# Patient Record
Sex: Female | Born: 1980 | Race: White | Hispanic: No | Marital: Married | Smoking: Never smoker
Health system: Southern US, Community
[De-identification: ages and names within clinical notes are randomized; demographics above are authoritative.]

## PROBLEM LIST (undated history)

## (undated) ENCOUNTER — Inpatient Hospital Stay (HOSPITAL_COMMUNITY): Payer: Self-pay

## (undated) DIAGNOSIS — R112 Nausea with vomiting, unspecified: Secondary | ICD-10-CM

## (undated) DIAGNOSIS — B009 Herpesviral infection, unspecified: Secondary | ICD-10-CM

## (undated) DIAGNOSIS — F32A Depression, unspecified: Secondary | ICD-10-CM

## (undated) DIAGNOSIS — I10 Essential (primary) hypertension: Secondary | ICD-10-CM

## (undated) DIAGNOSIS — T8859XA Other complications of anesthesia, initial encounter: Secondary | ICD-10-CM

## (undated) DIAGNOSIS — Z9889 Other specified postprocedural states: Secondary | ICD-10-CM

## (undated) DIAGNOSIS — N979 Female infertility, unspecified: Secondary | ICD-10-CM

## (undated) DIAGNOSIS — F329 Major depressive disorder, single episode, unspecified: Secondary | ICD-10-CM

## (undated) DIAGNOSIS — T4145XA Adverse effect of unspecified anesthetic, initial encounter: Secondary | ICD-10-CM

## (undated) HISTORY — PX: CHOLECYSTECTOMY: SHX55

---

## 2006-11-26 ENCOUNTER — Ambulatory Visit (HOSPITAL_COMMUNITY): Admission: RE | Admit: 2006-11-26 | Discharge: 2006-11-26 | Payer: Self-pay | Admitting: Obstetrics & Gynecology

## 2007-04-20 ENCOUNTER — Inpatient Hospital Stay (HOSPITAL_COMMUNITY): Admission: AD | Admit: 2007-04-20 | Discharge: 2007-04-22 | Payer: Self-pay | Admitting: Obstetrics and Gynecology

## 2008-03-29 IMAGING — US US OB COMP +14 WK
1 series · 14 of 28 positions shown · non-contrast
Comparison: none

OBSTETRICAL ULTRASOUND:

 This ultrasound exam was performed in the [HOSPITAL] Ultrasound Department.  The OB US report was generated in the AS system, and faxed to the ordering physician.  This report is also available in [REDACTED] PACS.

[Series 1: us ob comp +14 wk · 0.26mm/px · 14 of 75 slices shown]
[im 3/75]
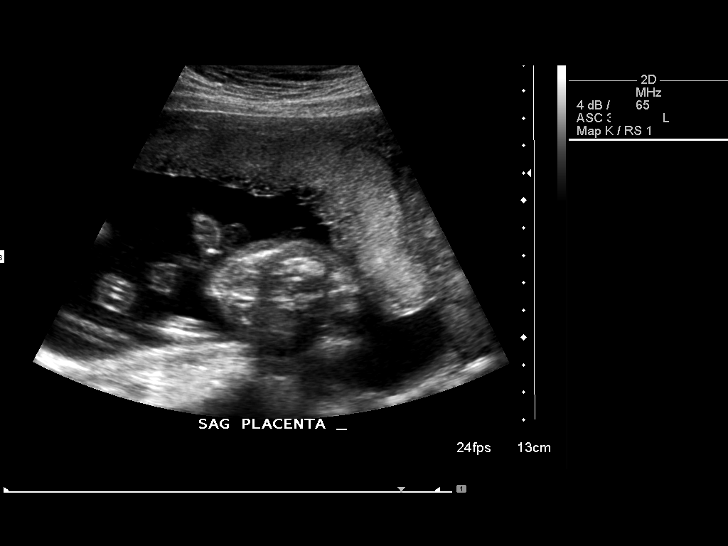
[im 9/75]
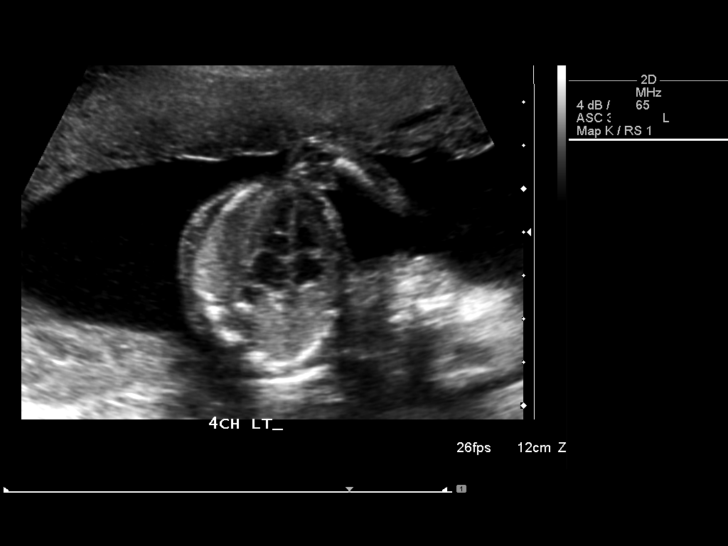
[im 14/75]
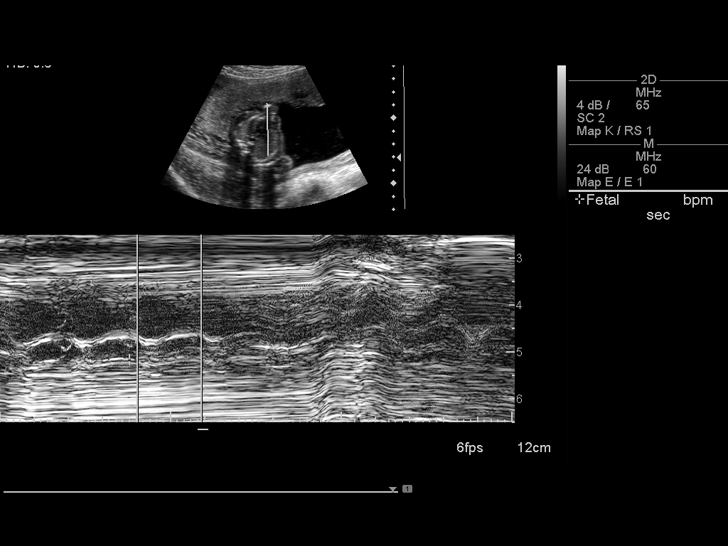
[im 20/75]
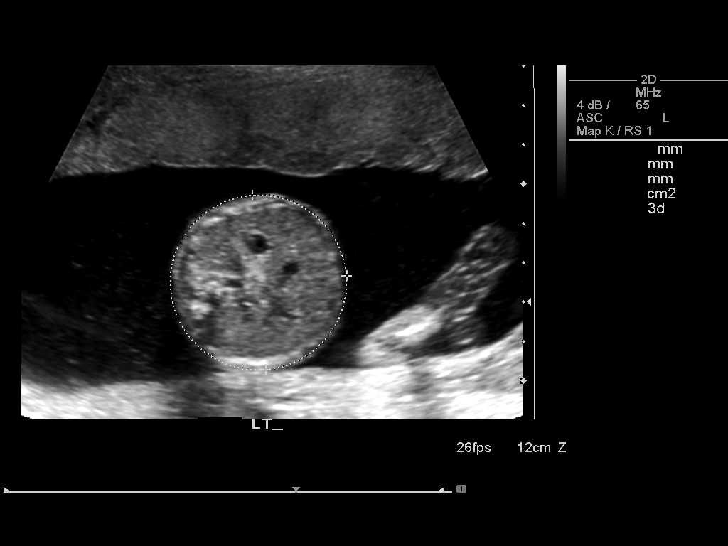
[im 25/75]
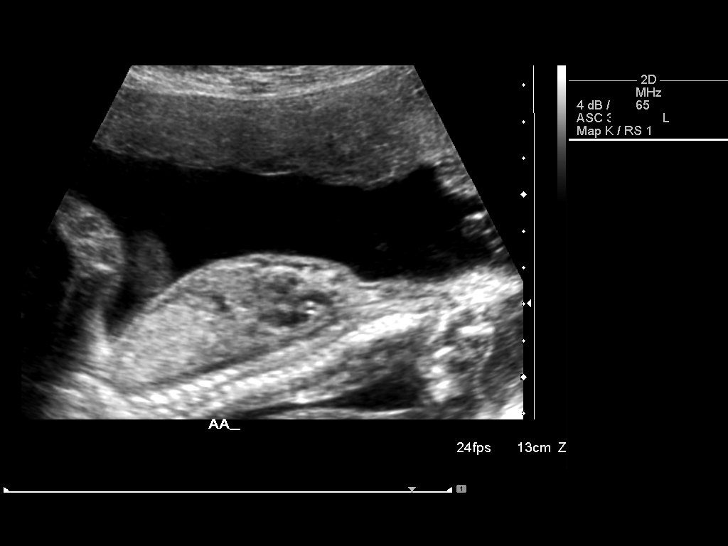
[im 31/75]
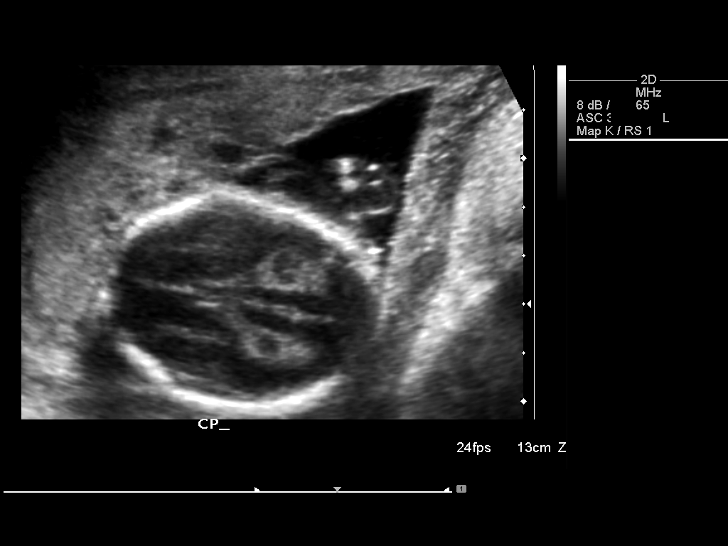
[im 36/75]
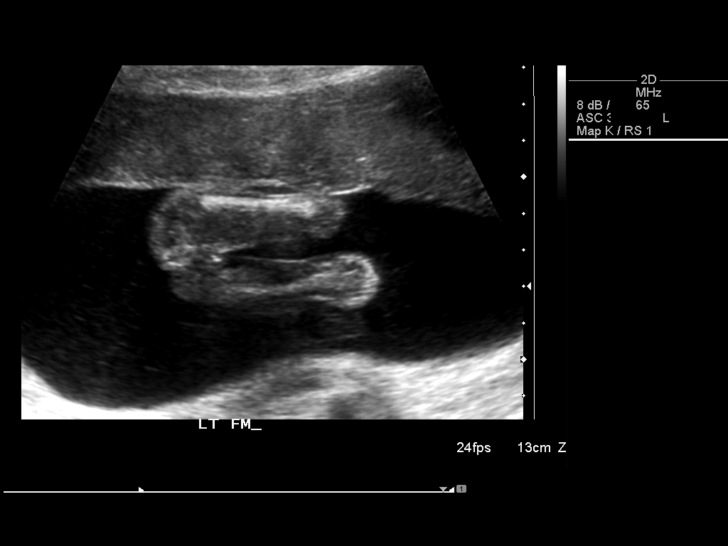
[im 42/75]
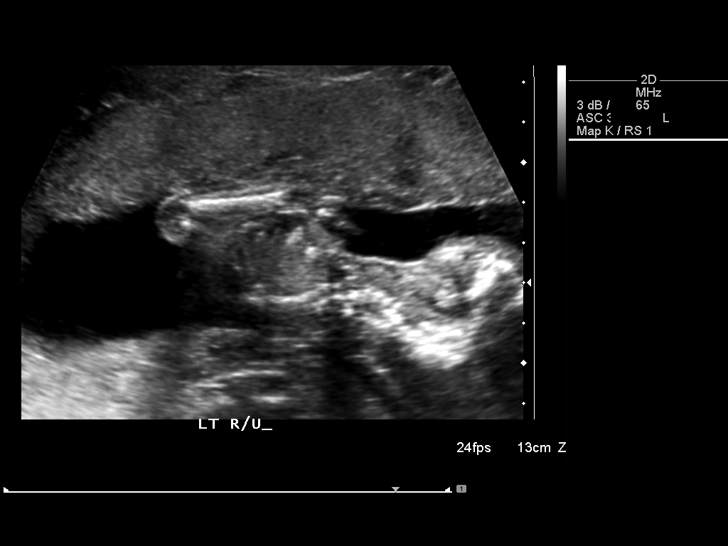
[im 47/75]
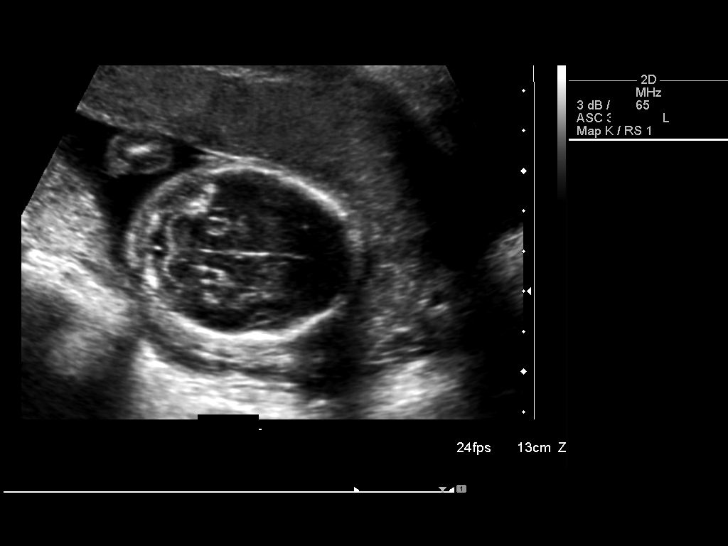
[im 53/75]
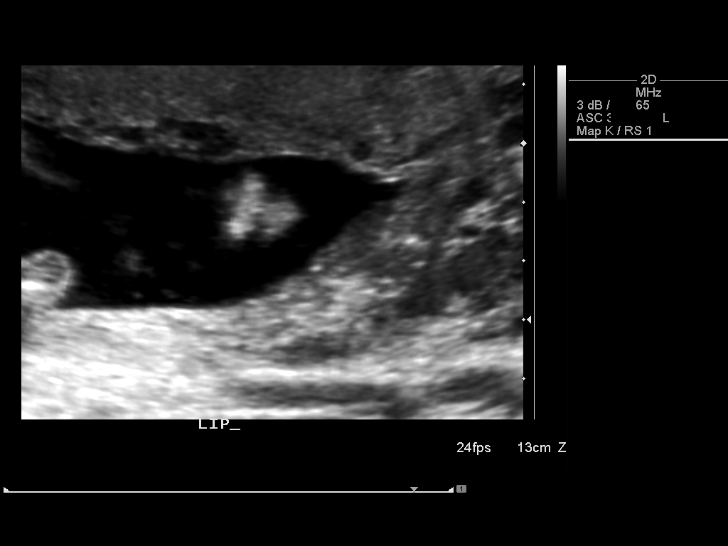
[im 58/75]
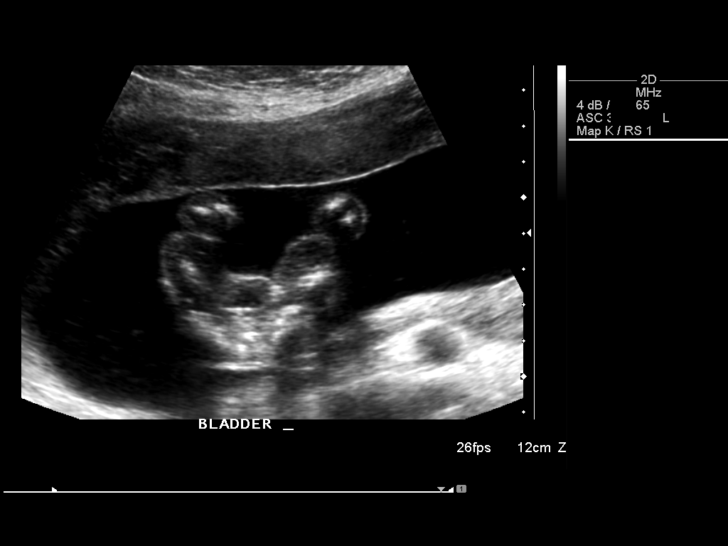
[im 64/75]
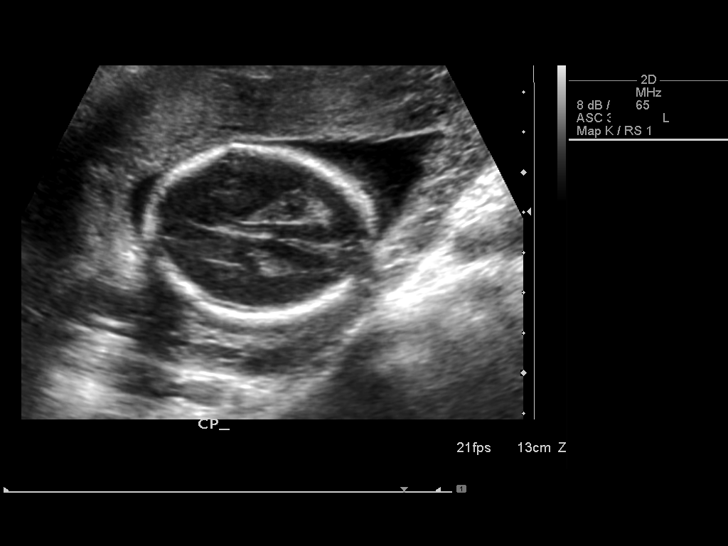
[im 69/75]
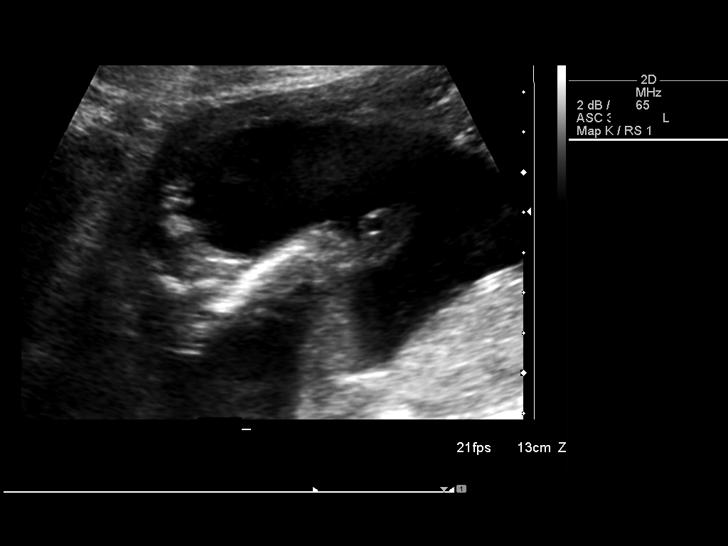
[im 75/75]
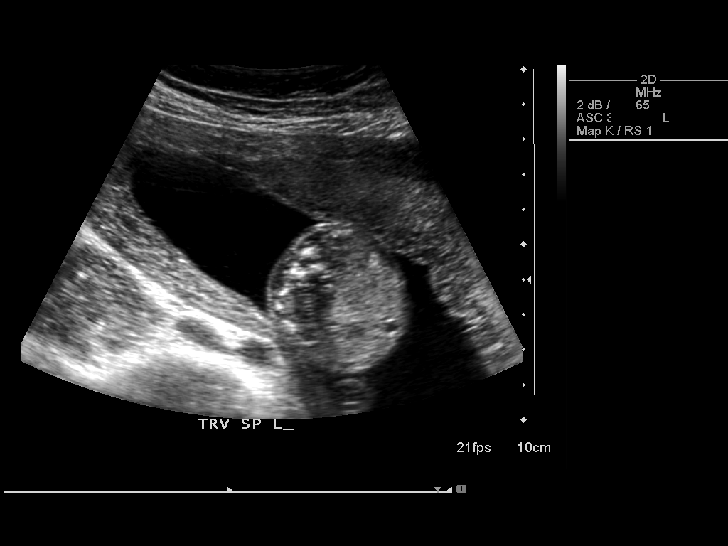

[14 of 28 positions shown; findings below may reference images not displayed]

IMPRESSION: See AS Obstetric US report.

## 2009-01-03 ENCOUNTER — Emergency Department (HOSPITAL_COMMUNITY): Admission: EM | Admit: 2009-01-03 | Discharge: 2009-01-03 | Payer: Self-pay | Admitting: Emergency Medicine

## 2011-01-22 NOTE — Discharge Summary (Signed)
Suzanne Santos, Suzanne Santos              ACCOUNT NO.:  [id number]   MEDICAL RECORD NO.:  [id number]          PATIENT TYPE:  INP   LOCATION:  9103                          FACILITY:  [facility]   PHYSICIAN:  Gerrit Friends. Aldona Bar, M.D.   DATE OF BIRTH:  04-25-1981   DATE OF ADMISSION:  04/20/2007  DATE OF DISCHARGE:  04/22/2007                               DISCHARGE SUMMARY   DISCHARGE DIAGNOSIS:  1. Term pregnancy delivered 7 pounds 8 ounces female infant, Apgars      09/09.  2. Blood type B positive.   PROCEDURES:  1. Normal spontaneous delivery.  2. Second-degree tear and repair.   SUMMARY:  This 30 year old primigravida was admitted at term with  contractions and minimal elevation of her blood pressure.  Her pregnancy  was complicated by the finding of bilateral choroid plexus cysts on her  anatomic scan at 18-20 weeks as well as a left ventricular echogenic  focus.  She was seen by the perinatal consultation.  Her quad screen  previously had been normal.  After counseling the patient declined  amniocentesis and was followed expectantly.   When she was admitted amniotomy was carried out with production of light  meconium-stained fluid.  The patient's labs were essentially normal.  Pitocin was added and epidural was requested and received and she  subsequently progressed and had a normal spontaneous delivery of 7  pounds 8 ounces female infant with Apgars of 09/09 over second-degree  tear which was repaired without difficulty.  There was a double nuchal  cord.  The patient's postpartum course was benign.  On the morning of  August 13, she was ambulating well, tolerating a regular diet well,  having normal bowel and bladder function, and was afebrile.  Her blood  pressure was stable, breast-feeding was going well and she was desirous  of discharge.  Discharge hemoglobin (August 12) was 8.3 with a white  count of 16,300, platelet count 200,000.   On the morning of August 13, she was given all  appropriate instructions  per discharge brochure and understood all instructions well.   DISCHARGE MEDICATIONS:  1. Prenatal vitamins - one a day.  2. Ferrous sulfate 325 mg daily.  3. She was given a prescription for Motrin 600 mg use every 6 hours as      needed for pain or cramps.   She will return to the office for follow-up in approximately four weeks'  time or as needed.   CONDITION ON DISCHARGE:  Improved.      Gerrit Friends. Aldona Bar, M.D.  Electronically Signed     RMW/MEDQ  D:  04/22/2007  T:  04/22/2007  Job:  [id number]

## 2011-06-24 LAB — CBC
HCT: 24.2 — ABNORMAL LOW
HCT: 34.1 — ABNORMAL LOW
Hemoglobin: 11.6 — ABNORMAL LOW
Hemoglobin: 8.3 — ABNORMAL LOW
MCHC: 34.1
MCV: 90.4
RBC: 3.77 — ABNORMAL LOW
RDW: 14.5 — ABNORMAL HIGH

## 2011-06-24 LAB — URIC ACID: Uric Acid, Serum: 4.2

## 2011-06-24 LAB — COMPREHENSIVE METABOLIC PANEL
ALT: 15
BUN: 5 — ABNORMAL LOW
CO2: 24
Calcium: 9.3
Creatinine, Ser: 0.57
GFR calc non Af Amer: >60
Glucose, Bld: 84

## 2011-06-24 LAB — URINALYSIS, ROUTINE W REFLEX MICROSCOPIC
Glucose, UA: NEGATIVE
Ketones, ur: NEGATIVE
Leukocytes, UA: NEGATIVE
Protein, ur: NEGATIVE

## 2011-06-24 LAB — LACTATE DEHYDROGENASE: LDH: 144

## 2011-06-24 LAB — URINE MICROSCOPIC-ADD ON

## 2011-06-24 LAB — RPR: RPR Ser Ql: NONREACTIVE

## 2011-09-05 ENCOUNTER — Other Ambulatory Visit (HOSPITAL_COMMUNITY): Payer: Self-pay | Admitting: Obstetrics and Gynecology

## 2011-09-05 DIAGNOSIS — N979 Female infertility, unspecified: Secondary | ICD-10-CM

## 2011-09-09 ENCOUNTER — Ambulatory Visit (HOSPITAL_COMMUNITY)
Admission: RE | Admit: 2011-09-09 | Discharge: 2011-09-09 | Disposition: A | Discharge status: Home / Self Care | Payer: 59 | Source: Ambulatory Visit | Attending: Obstetrics and Gynecology | Admitting: Obstetrics and Gynecology

## 2011-09-09 ENCOUNTER — Other Ambulatory Visit (HOSPITAL_COMMUNITY): Payer: Self-pay | Admitting: Obstetrics and Gynecology

## 2011-09-09 DIAGNOSIS — N979 Female infertility, unspecified: Secondary | ICD-10-CM

## 2011-10-15 ENCOUNTER — Emergency Department (HOSPITAL_BASED_OUTPATIENT_CLINIC_OR_DEPARTMENT_OTHER)
Admission: EM | Admit: 2011-10-15 | Discharge: 2011-10-15 | Disposition: A | Discharge status: Home / Self Care | Payer: 59 | Attending: Emergency Medicine | Admitting: Emergency Medicine

## 2011-10-15 ENCOUNTER — Emergency Department (INDEPENDENT_AMBULATORY_CARE_PROVIDER_SITE_OTHER): Payer: 59

## 2011-10-15 ENCOUNTER — Encounter (HOSPITAL_BASED_OUTPATIENT_CLINIC_OR_DEPARTMENT_OTHER): Payer: Self-pay | Admitting: *Deleted

## 2011-10-15 DIAGNOSIS — M542 Cervicalgia: Secondary | ICD-10-CM | POA: Insufficient documentation

## 2011-10-15 DIAGNOSIS — M5412 Radiculopathy, cervical region: Secondary | ICD-10-CM | POA: Insufficient documentation

## 2011-10-15 DIAGNOSIS — M5020 Other cervical disc displacement, unspecified cervical region: Secondary | ICD-10-CM

## 2011-10-15 DIAGNOSIS — M79609 Pain in unspecified limb: Secondary | ICD-10-CM

## 2011-10-15 MED ORDER — PREDNISONE 20 MG PO TABS: ORAL_TABLET | ORAL | Status: DC

## 2011-10-15 MED ORDER — IBUPROFEN 800 MG PO TABS: 800.0000 mg | ORAL_TABLET | Freq: Three times a day (TID) | ORAL | Status: AC

## 2011-10-15 MED ORDER — IBUPROFEN 800 MG PO TABS
800.0000 mg | ORAL_TABLET | Freq: Once | ORAL | Status: AC
  Administered 2011-10-15: 800 mg via ORAL
  Filled 2011-10-15: qty 1

## 2011-10-15 NOTE — ED Notes (Signed)
Patient states she woke up one week ago with pain in the center of her neck , felt like she slept wrong.  Pain is now radiating into her left upper arm, denies numbness or injury.

## 2011-10-15 NOTE — ED Provider Notes (Signed)
History     CSN: [id number]  Arrival date & time 10/15/11  4098   None     Chief Complaint  Patient presents with  . Neck Pain    radiating into left arm    (Consider location/radiation/quality/duration/timing/severity/associated sxs/prior treatment) Patient is a 31 y.o. female presenting with neck pain. The history is provided by the patient and medical records. No language interpreter was used.  Neck Pain  This is a new problem. Episode onset: Patient says that 4 weeks she's had a" crick" in her neck. She woke up like that one day. She has tried ibuprofen and Tylenol and Flexeril without relief. The problem occurs constantly. The problem has been gradually worsening. Associated with: There is no history of injury. There has been no fever. The pain is present in the left side. The quality of the pain is described as aching. The pain radiates to the left arm. The pain is at a severity of 7/10. The pain is moderate. Exacerbated by: Nothing. Pertinent negatives include no numbness, no bowel incontinence, no bladder incontinence, no paresis, no tingling and no weakness. She has tried NSAIDs and analgesics for the symptoms. The treatment provided no relief.    History reviewed. No pertinent past medical history.  Past Surgical History  Procedure Date  . Cholecystectomy     No family history on file.  History  Substance Use Topics  . Smoking status: Never Smoker   . Smokeless tobacco: Not on file  . Alcohol Use: Yes     occassionally    OB History    Grav Para Term Preterm Abortions TAB SAB Ect Mult Living                  Review of Systems  Constitutional: Negative for fever and chills.  HENT: Positive for neck pain.   Eyes: Negative.   Respiratory: Negative.   Cardiovascular: Negative.   Gastrointestinal: Negative.  Negative for bowel incontinence.  Genitourinary: Negative.  Negative for bladder incontinence.  Skin: Negative.   Neurological: Negative for tingling,  weakness and numbness.  Psychiatric/Behavioral: Negative.     Allergies  Review of patient's allergies indicates no known allergies.  Home Medications   Current Outpatient Rx  Name Route Sig Dispense Refill  . FLUOXETINE HCL 20 MG PO CAPS Oral Take 20 mg by mouth daily.    Marland Kitchen FOLIC ACID 1 MG PO TABS Oral Take 1 mg by mouth daily.    Marland Kitchen LETROZOLE 2.5 MG PO TABS Oral Take 2.5 mg by mouth daily. Days 3 through day 7 of cycle    . MULTI-VITAMIN/MINERALS PO TABS Oral Take 1 tablet by mouth daily.      BP 150/98  Pulse 58  Temp(Src) 98.4 F (36.9 C) (Oral)  Resp 20  Ht 5\' 7"  (1.702 m)  Wt 152 lb (68.947 kg)  BMI 23.81 kg/m2  SpO2 100%  LMP 10/01/2011  Physical Exam  Nursing note and vitals reviewed. Constitutional: She is oriented to person, place, and time. She appears well-developed and well-nourished. No distress.  HENT:  Head: Normocephalic and atraumatic.  Right Ear: External ear normal.  Left Ear: External ear normal.  Mouth/Throat: Oropharynx is clear and moist.  Eyes: Conjunctivae and EOM are normal. Pupils are equal, round, and reactive to light.  Neck:       She localizes pain to the left posterior cervical paraspinous muscles. There is no palpable deformity. Range of motion of her neck is full. She states that the  pain goes from her neck into her left arm.  Cardiovascular: Normal rate, regular rhythm and normal heart sounds.   Pulmonary/Chest: Effort normal and breath sounds normal.  Abdominal: Soft. Bowel sounds are normal.  Musculoskeletal: Normal range of motion.  Neurological: She is alert and oriented to person, place, and time. She has normal reflexes.       No sensory or motor deficits.  Skin: Skin is warm and dry.  Psychiatric: She has a normal mood and affect. Her behavior is normal.    ED Course  Procedures (including critical care time)  8:09 AM Patient was seen and had physical examination. MRI of the cervical spine was ordered. Oral ibuprofen was  ordered.  12:23 PM MRI shows L C6 impingment.  Rx with steroid taper, ibuprofen, F/U with Dr. Jeral Fruit, her choice of neurosurgeon.  1. Cervical radiculopathy at C6           Carleene Cooper III, MD 10/16/11 [phone]

## 2011-10-29 ENCOUNTER — Other Ambulatory Visit: Payer: Self-pay | Admitting: Neurosurgery

## 2011-10-29 ENCOUNTER — Encounter (HOSPITAL_COMMUNITY): Payer: Self-pay | Admitting: Pharmacy Technician

## 2011-11-04 NOTE — Pre-Procedure Instructions (Signed)
20 NAILANI FULL  11/04/2011   Your procedure is scheduled on:  Friday November 08, 2011  Report to [name] at 9:30 AM.  Call this number if you have problems the morning of surgery: [phone]   Remember:   Do not eat food:After Midnight.  May have clear liquids: up to 4 Hours before arrival. (up to 5:30am)  Clear liquids include soda, tea, black coffee, apple or grape juice, broth.  Take these medicines the morning of surgery with A SIP OF WATER: prozac, prednisone   Do not wear jewelry, make-up or nail polish.  Do not wear lotions, powders, or perfumes. You may wear deodorant.  Do not shave 48 hours prior to surgery.  Do not bring valuables to the hospital.  Contacts, dentures or bridgework may not be worn into surgery.  Leave suitcase in the car. After surgery it may be brought to your room.  For patients admitted to the hospital, checkout time is 11:00 AM the day of discharge.   Patients discharged the day of surgery will not be allowed to drive home.  Name and phone number of your driver:  LOU LEWIS--- MOTHER      [phone]*  Special Instructions: CHG Shower Use Special Wash: 1/2 bottle night before surgery and 1/2 bottle morning of surgery.   Please read over the following fact sheets that you were given: Pain Booklet, Coughing and Deep Breathing, MRSA Information and Surgical Site Infection Prevention

## 2011-11-05 ENCOUNTER — Encounter (HOSPITAL_COMMUNITY)
Admission: RE | Admit: 2011-11-05 | Discharge: 2011-11-05 | Disposition: A | Discharge status: Home / Self Care | Payer: 59 | Source: Ambulatory Visit | Attending: Neurosurgery | Admitting: Neurosurgery

## 2011-11-05 ENCOUNTER — Encounter (HOSPITAL_COMMUNITY): Payer: Self-pay

## 2011-11-05 HISTORY — DX: Nausea with vomiting, unspecified: R11.2

## 2011-11-05 HISTORY — DX: Major depressive disorder, single episode, unspecified: F32.9

## 2011-11-05 HISTORY — DX: Other specified postprocedural states: Z98.890

## 2011-11-05 HISTORY — DX: Depression, unspecified: F32.A

## 2011-11-05 LAB — SURGICAL PCR SCREEN
MRSA, PCR: NEGATIVE
Staphylococcus aureus: NEGATIVE

## 2011-11-05 LAB — BASIC METABOLIC PANEL
CO2: 27 mEq/L (ref 19–32)
Chloride: 103 mEq/L (ref 96–112)
GFR calc Af Amer: >90 mL/min (ref >90)
Potassium: 3.7 mEq/L (ref 3.5–5.1)
Sodium: 139 mEq/L (ref 135–145)

## 2011-11-05 LAB — CBC
HCT: 36 % (ref 36.0–46.0)
Hemoglobin: 11.5 g/dL — ABNORMAL LOW (ref 12.0–15.0)
RBC: 3.95 MIL/uL (ref 3.87–5.11)
RDW: 12.7 % (ref 11.5–15.5)
WBC: 6.1 10*3/uL (ref 4.0–10.5)

## 2011-11-07 MED ORDER — CEFAZOLIN SODIUM 1-5 GM-% IV SOLN
1.0000 g | INTRAVENOUS | Status: AC
  Administered 2011-11-08: 1 g via INTRAVENOUS
  Filled 2011-11-07: qty 50

## 2011-11-08 ENCOUNTER — Encounter (HOSPITAL_COMMUNITY): Payer: Self-pay | Admitting: Anesthesiology

## 2011-11-08 ENCOUNTER — Ambulatory Visit (HOSPITAL_COMMUNITY): Payer: 59

## 2011-11-08 ENCOUNTER — Ambulatory Visit (HOSPITAL_COMMUNITY): Payer: 59 | Admitting: Anesthesiology

## 2011-11-08 ENCOUNTER — Ambulatory Visit (HOSPITAL_COMMUNITY)
Admission: RE | Admit: 2011-11-08 | Discharge: 2011-11-09 | Disposition: A | Discharge status: Home / Self Care | Payer: 59 | Source: Ambulatory Visit | Attending: Neurosurgery | Admitting: Neurosurgery

## 2011-11-08 ENCOUNTER — Encounter (HOSPITAL_COMMUNITY)
Admission: RE | Disposition: A | Discharge status: Home / Self Care | Payer: Self-pay | Source: Ambulatory Visit | Attending: Neurosurgery

## 2011-11-08 DIAGNOSIS — F329 Major depressive disorder, single episode, unspecified: Secondary | ICD-10-CM | POA: Insufficient documentation

## 2011-11-08 DIAGNOSIS — M5020 Other cervical disc displacement, unspecified cervical region: Secondary | ICD-10-CM | POA: Insufficient documentation

## 2011-11-08 DIAGNOSIS — Z01812 Encounter for preprocedural laboratory examination: Secondary | ICD-10-CM | POA: Insufficient documentation

## 2011-11-08 DIAGNOSIS — F3289 Other specified depressive episodes: Secondary | ICD-10-CM | POA: Insufficient documentation

## 2011-11-08 DIAGNOSIS — M47812 Spondylosis without myelopathy or radiculopathy, cervical region: Secondary | ICD-10-CM | POA: Insufficient documentation

## 2011-11-08 HISTORY — PX: ANTERIOR CERVICAL DECOMP/DISCECTOMY FUSION: SHX1161

## 2011-11-08 SURGERY — ANTERIOR CERVICAL DECOMPRESSION/DISCECTOMY FUSION 1 LEVEL
Anesthesia: General | Wound class: Clean

## 2011-11-08 MED ORDER — PHENOL 1.4 % MT LIQD: 1.0000 | OROMUCOSAL | Status: DC | PRN

## 2011-11-08 MED ORDER — ONDANSETRON HCL 4 MG/2ML IJ SOLN
INTRAMUSCULAR | Status: DC | PRN
  Administered 2011-11-08: 4 mg via INTRAVENOUS

## 2011-11-08 MED ORDER — HYDROMORPHONE HCL PF 1 MG/ML IJ SOLN: 0.2500 mg | INTRAMUSCULAR | Status: DC | PRN

## 2011-11-08 MED ORDER — GLYCOPYRROLATE 0.2 MG/ML IJ SOLN
INTRAMUSCULAR | Status: DC | PRN
  Administered 2011-11-08: .5 mg via INTRAVENOUS

## 2011-11-08 MED ORDER — SODIUM CHLORIDE 0.9 % IJ SOLN: 3.0000 mL | INTRAMUSCULAR | Status: DC | PRN

## 2011-11-08 MED ORDER — THROMBIN 5000 UNITS EX SOLR
CUTANEOUS | Status: DC | PRN
  Administered 2011-11-08 (×2): 5000 [IU] via TOPICAL

## 2011-11-08 MED ORDER — DOCUSATE SODIUM 100 MG PO CAPS
100.0000 mg | ORAL_CAPSULE | Freq: Two times a day (BID) | ORAL | Status: DC
  Administered 2011-11-08 – 2011-11-09 (×2): 100 mg via ORAL
  Filled 2011-11-08 (×2): qty 1

## 2011-11-08 MED ORDER — MORPHINE SULFATE 4 MG/ML IJ SOLN: 4.0000 mg | INTRAMUSCULAR | Status: DC | PRN

## 2011-11-08 MED ORDER — ZOLPIDEM TARTRATE 5 MG PO TABS: 10.0000 mg | ORAL_TABLET | Freq: Every evening | ORAL | Status: DC | PRN

## 2011-11-08 MED ORDER — SCOPOLAMINE 1 MG/3DAYS TD PT72: 1.0000 | MEDICATED_PATCH | TRANSDERMAL | Status: DC

## 2011-11-08 MED ORDER — HEMOSTATIC AGENTS (NO CHARGE) OPTIME
TOPICAL | Status: DC | PRN
  Administered 2011-11-08: 1 via TOPICAL

## 2011-11-08 MED ORDER — FLUOXETINE HCL 20 MG PO CAPS
20.0000 mg | ORAL_CAPSULE | Freq: Every day | ORAL | Status: DC
  Filled 2011-11-08: qty 1

## 2011-11-08 MED ORDER — BACITRACIN 50000 UNITS IM SOLR
INTRAMUSCULAR | Status: AC
  Filled 2011-11-08: qty 1

## 2011-11-08 MED ORDER — MORPHINE SULFATE 2 MG/ML IJ SOLN: 0.0500 mg/kg | INTRAMUSCULAR | Status: DC | PRN

## 2011-11-08 MED ORDER — MEPERIDINE HCL 25 MG/ML IJ SOLN: 6.2500 mg | INTRAMUSCULAR | Status: DC | PRN

## 2011-11-08 MED ORDER — FENTANYL CITRATE 0.05 MG/ML IJ SOLN
INTRAMUSCULAR | Status: DC | PRN
  Administered 2011-11-08 (×2): 50 ug via INTRAVENOUS
  Administered 2011-11-08: 150 ug via INTRAVENOUS

## 2011-11-08 MED ORDER — MIDAZOLAM HCL 5 MG/5ML IJ SOLN
INTRAMUSCULAR | Status: DC | PRN
  Administered 2011-11-08: 2 mg via INTRAVENOUS

## 2011-11-08 MED ORDER — LACTATED RINGERS IV SOLN
INTRAVENOUS | Status: DC | PRN
  Administered 2011-11-08 (×2): via INTRAVENOUS

## 2011-11-08 MED ORDER — ONDANSETRON HCL 4 MG/2ML IJ SOLN
4.0000 mg | Freq: Once | INTRAMUSCULAR | Status: AC | PRN
  Administered 2011-11-08: 4 mg via INTRAVENOUS

## 2011-11-08 MED ORDER — 0.9 % SODIUM CHLORIDE (POUR BTL) OPTIME
TOPICAL | Status: DC | PRN
  Administered 2011-11-08: 1000 mL

## 2011-11-08 MED ORDER — ACETAMINOPHEN 325 MG PO TABS: 650.0000 mg | ORAL_TABLET | ORAL | Status: DC | PRN

## 2011-11-08 MED ORDER — MENTHOL 3 MG MT LOZG: 1.0000 | LOZENGE | OROMUCOSAL | Status: DC | PRN

## 2011-11-08 MED ORDER — SCOPOLAMINE 1 MG/3DAYS TD PT72
MEDICATED_PATCH | TRANSDERMAL | Status: DC | PRN
  Administered 2011-11-08: 1.5 mg via TRANSDERMAL

## 2011-11-08 MED ORDER — HYDROMORPHONE HCL PF 1 MG/ML IJ SOLN
INTRAMUSCULAR | Status: AC
  Filled 2011-11-08: qty 1

## 2011-11-08 MED ORDER — HYDROMORPHONE HCL PF 1 MG/ML IJ SOLN
0.2500 mg | INTRAMUSCULAR | Status: DC | PRN
  Administered 2011-11-08 (×2): 0.5 mg via INTRAVENOUS

## 2011-11-08 MED ORDER — MORPHINE SULFATE 2 MG/ML IJ SOLN
INTRAMUSCULAR | Status: AC
  Administered 2011-11-08: 2 mg via INTRAVENOUS
  Filled 2011-11-08: qty 1

## 2011-11-08 MED ORDER — DIAZEPAM 5 MG PO TABS
5.0000 mg | ORAL_TABLET | Freq: Four times a day (QID) | ORAL | Status: DC | PRN
  Administered 2011-11-08: 5 mg via ORAL
  Filled 2011-11-08: qty 1

## 2011-11-08 MED ORDER — SODIUM CHLORIDE 0.9 % IJ SOLN
3.0000 mL | Freq: Two times a day (BID) | INTRAMUSCULAR | Status: DC
  Administered 2011-11-08 (×2): 3 mL via INTRAVENOUS

## 2011-11-08 MED ORDER — OXYCODONE-ACETAMINOPHEN 5-325 MG PO TABS
1.0000 | ORAL_TABLET | ORAL | Status: DC | PRN
  Administered 2011-11-08 – 2011-11-09 (×5): 1 via ORAL
  Filled 2011-11-08 (×2): qty 1
  Filled 2011-11-08 (×2): qty 2
  Filled 2011-11-08: qty 1

## 2011-11-08 MED ORDER — ACETAMINOPHEN 650 MG RE SUPP: 650.0000 mg | RECTAL | Status: DC | PRN

## 2011-11-08 MED ORDER — ONDANSETRON HCL 4 MG/2ML IJ SOLN
INTRAMUSCULAR | Status: AC
  Filled 2011-11-08: qty 2

## 2011-11-08 MED ORDER — ONDANSETRON HCL 4 MG/2ML IJ SOLN: 4.0000 mg | Freq: Once | INTRAMUSCULAR | Status: DC | PRN

## 2011-11-08 MED ORDER — SODIUM CHLORIDE 0.9 % IV SOLN
INTRAVENOUS | Status: DC
  Administered 2011-11-08: 13:00:00 via INTRAVENOUS

## 2011-11-08 MED ORDER — CEFAZOLIN SODIUM 1-5 GM-% IV SOLN
1.0000 g | Freq: Three times a day (TID) | INTRAVENOUS | Status: AC
  Administered 2011-11-08: 1 g via INTRAVENOUS
  Filled 2011-11-08 (×2): qty 50

## 2011-11-08 MED ORDER — NEOSTIGMINE METHYLSULFATE 1 MG/ML IJ SOLN
INTRAMUSCULAR | Status: DC | PRN
  Administered 2011-11-08: 3 mg via INTRAVENOUS

## 2011-11-08 MED ORDER — THROMBIN 5000 UNITS EX SOLR
CUTANEOUS | Status: DC | PRN
  Administered 2011-11-08: 5000 [IU] via TOPICAL

## 2011-11-08 MED ORDER — ROCURONIUM BROMIDE 100 MG/10ML IV SOLN
INTRAVENOUS | Status: DC | PRN
  Administered 2011-11-08: 50 mg via INTRAVENOUS

## 2011-11-08 MED ORDER — ONDANSETRON HCL 4 MG/2ML IJ SOLN
4.0000 mg | INTRAMUSCULAR | Status: DC | PRN
  Administered 2011-11-09: 4 mg via INTRAVENOUS
  Filled 2011-11-08: qty 2

## 2011-11-08 MED ORDER — SODIUM CHLORIDE 0.9 % IV SOLN
INTRAVENOUS | Status: AC
  Filled 2011-11-08: qty 500

## 2011-11-08 MED ORDER — DEXAMETHASONE SODIUM PHOSPHATE 4 MG/ML IJ SOLN
INTRAMUSCULAR | Status: DC | PRN
  Administered 2011-11-08: 8 mg via INTRAVENOUS

## 2011-11-08 MED ORDER — MORPHINE SULFATE 4 MG/ML IJ SOLN: 0.0500 mg/kg | INTRAMUSCULAR | Status: DC | PRN

## 2011-11-08 MED ORDER — PROPOFOL 10 MG/ML IV EMUL
INTRAVENOUS | Status: DC | PRN
  Administered 2011-11-08: 120 mg via INTRAVENOUS

## 2011-11-08 MED ORDER — LIDOCAINE HCL (CARDIAC) 20 MG/ML IV SOLN
INTRAVENOUS | Status: DC | PRN
  Administered 2011-11-08: 50 mg via INTRAVENOUS

## 2011-11-08 SURGICAL SUPPLY — 61 items
APL SKNCLS STERI-STRIP NONHPOA (GAUZE/BANDAGES/DRESSINGS) ×1
BANDAGE GAUZE ELAST BULKY 4 IN (GAUZE/BANDAGES/DRESSINGS) ×4 IMPLANT
BENZOIN TINCTURE PRP APPL 2/3 (GAUZE/BANDAGES/DRESSINGS) ×2 IMPLANT
BIT DRILL SPINE QC 12 (BIT) ×1 IMPLANT
BLADE ULTRA TIP 2M (BLADE) ×2 IMPLANT
BUR BARREL STRAIGHT FLUTE 4.0 (BURR) ×1 IMPLANT
BUR MATCHSTICK NEURO 3.0 LAGG (BURR) ×2 IMPLANT
CANISTER SUCTION 2500CC (MISCELLANEOUS) ×2 IMPLANT
CLOTH BEACON ORANGE TIMEOUT ST (SAFETY) ×2 IMPLANT
CONT SPEC 4OZ CLIKSEAL STRL BL (MISCELLANEOUS) ×2 IMPLANT
COVER MAYO STAND STRL (DRAPES) ×2 IMPLANT
DRAPE LAPAROTOMY 100X72 PEDS (DRAPES) ×2 IMPLANT
DRAPE MICROSCOPE LEICA (MISCELLANEOUS) ×2 IMPLANT
DRAPE POUCH INSTRU U-SHP 10X18 (DRAPES) ×2 IMPLANT
DRAPE PROXIMA HALF (DRAPES) ×1 IMPLANT
DURAPREP 6ML APPLICATOR 50/CS (WOUND CARE) ×2 IMPLANT
ELECT REM PT RETURN 9FT ADLT (ELECTROSURGICAL) ×2
ELECTRODE REM PT RTRN 9FT ADLT (ELECTROSURGICAL) ×1 IMPLANT
GAUZE SPONGE 4X4 16PLY XRAY LF (GAUZE/BANDAGES/DRESSINGS) IMPLANT
GLOVE BIOGEL M 8.0 STRL (GLOVE) ×3 IMPLANT
GLOVE BIOGEL PI IND STRL 7.0 (GLOVE) IMPLANT
GLOVE BIOGEL PI IND STRL 8 (GLOVE) IMPLANT
GLOVE BIOGEL PI INDICATOR 7.0 (GLOVE) ×1
GLOVE BIOGEL PI INDICATOR 8 (GLOVE) ×1
GLOVE ECLIPSE 7.5 STRL STRAW (GLOVE) ×2 IMPLANT
GLOVE ECLIPSE 8.5 STRL (GLOVE) ×1 IMPLANT
GLOVE EXAM NITRILE LRG STRL (GLOVE) IMPLANT
GLOVE EXAM NITRILE MD LF STRL (GLOVE) IMPLANT
GLOVE EXAM NITRILE XL STR (GLOVE) IMPLANT
GLOVE EXAM NITRILE XS STR PU (GLOVE) IMPLANT
GLOVE SS BIOGEL STRL SZ 6.5 (GLOVE) IMPLANT
GLOVE SUPERSENSE BIOGEL SZ 6.5 (GLOVE) ×1
GLOVE SURG SS PI 6.5 STRL IVOR (GLOVE) ×2 IMPLANT
GOWN BRE IMP SLV AUR LG STRL (GOWN DISPOSABLE) ×3 IMPLANT
GOWN BRE IMP SLV AUR XL STRL (GOWN DISPOSABLE) ×2 IMPLANT
GOWN STRL REIN 2XL LVL4 (GOWN DISPOSABLE) IMPLANT
HEMOSTAT POWDER KIT SURGIFOAM (HEMOSTASIS) ×1 IMPLANT
HEMOSTAT POWDER SURGIFOAM 1G (HEMOSTASIS) ×1 IMPLANT
KIT BASIN OR (CUSTOM PROCEDURE TRAY) ×2 IMPLANT
KIT ROOM TURNOVER OR (KITS) ×2 IMPLANT
NDL SPNL 22GX3.5 QUINCKE BK (NEEDLE) ×1 IMPLANT
NEEDLE SPNL 22GX3.5 QUINCKE BK (NEEDLE) ×2 IMPLANT
NS IRRIG 1000ML POUR BTL (IV SOLUTION) ×2 IMPLANT
PACK LAMINECTOMY NEURO (CUSTOM PROCEDURE TRAY) ×2 IMPLANT
PATTIES SURGICAL .5 X1 (DISPOSABLE) ×2 IMPLANT
PLATE ANT CERV XTEND 1 LV 16 (Plate) ×1 IMPLANT
PUTTY DBX 1CC (Putty) ×2 IMPLANT
PUTTY DBX 1CC DEPUY (Putty) IMPLANT
RUBBERBAND STERILE (MISCELLANEOUS) ×4 IMPLANT
SCREW XTD VAR 4.2 SELF TAP 12 (Screw) ×8 IMPLANT
SPACER COLONIAL SMALL 8MM (Spacer) ×1 IMPLANT
SPONGE GAUZE 4X4 12PLY (GAUZE/BANDAGES/DRESSINGS) ×1 IMPLANT
SPONGE INTESTINAL PEANUT (DISPOSABLE) ×3 IMPLANT
SPONGE SURGIFOAM ABS GEL SZ50 (HEMOSTASIS) ×2 IMPLANT
STRIP CLOSURE SKIN 1/2X4 (GAUZE/BANDAGES/DRESSINGS) ×1 IMPLANT
SUT VIC AB 3-0 SH 8-18 (SUTURE) ×2 IMPLANT
SYR 20ML ECCENTRIC (SYRINGE) ×2 IMPLANT
TAPE CLOTH SURG 4X10 WHT LF (GAUZE/BANDAGES/DRESSINGS) ×1 IMPLANT
TOWEL OR 17X24 6PK STRL BLUE (TOWEL DISPOSABLE) ×2 IMPLANT
TOWEL OR 17X26 10 PK STRL BLUE (TOWEL DISPOSABLE) ×2 IMPLANT
WATER STERILE IRR 1000ML POUR (IV SOLUTION) ×2 IMPLANT

## 2011-11-08 NOTE — Anesthesia Preprocedure Evaluation (Addendum)
Anesthesia Evaluation  Patient identified by MRN, date of birth, ID band Patient awake    Reviewed: Allergy & Precautions, H&P , NPO status , Patient's Chart, lab work & pertinent test results  History of Anesthesia Complications (+) PONV  Airway       Dental   Pulmonary          Cardiovascular     Neuro/Psych Depression    GI/Hepatic   Endo/Other    Renal/GU      Musculoskeletal   Abdominal   Peds  Hematology   Anesthesia Other Findings   Reproductive/Obstetrics                          Anesthesia Physical Anesthesia Plan  ASA: II  Anesthesia Plan: General   Post-op Pain Management:    Induction: Intravenous  Airway Management Planned: Oral ETT  Additional Equipment:   Intra-op Plan:   Post-operative Plan: Extubation in OR  Informed Consent: I have reviewed the patients History and Physical, chart, labs and discussed the procedure including the risks, benefits and alternatives for the proposed anesthesia with the patient or authorized representative who has indicated his/her understanding and acceptance.   Dental advisory given  Plan Discussed with: CRNA and Surgeon  Anesthesia Plan Comments:        Anesthesia Quick Evaluation

## 2011-11-08 NOTE — Anesthesia Postprocedure Evaluation (Signed)
Anesthesia Post Note  Patient: Suzanne Santos  Procedure(s) Performed: Procedure(s) (LRB): ANTERIOR CERVICAL DECOMPRESSION/DISCECTOMY FUSION 1 LEVEL (N/A)  Anesthesia type: general  Patient location: PACU  Post pain: Pain level controlled  Post assessment: Patient's Cardiovascular Status Stable  Last Vitals:  Filed Vitals:   11/08/11 0945  BP: 130/74  Pulse: 67  Temp:   Resp: 15    Post vital signs: Reviewed and stable  Level of consciousness: sedated  Complications: No apparent anesthesia complications

## 2011-11-08 NOTE — Preoperative (Signed)
Beta Blockers   Reason not to administer Beta Blockers:Not Applicable 

## 2011-11-08 NOTE — H&P (Signed)
Suzanne Santos is an 31 y.o. female.   Chief Complaint: neck pain. HPI: 7 weeks ago suddenly she started to have neck pain which required adjustment. Later on the pain got worse with radiation to the left upper extremity associteda with tingling. No help with conservative treatment. Mri of the cervical spine was done and send to [place].  Past Medical History  Diagnosis Date  . PONV (postoperative nausea and vomiting)     AFTER GALLBLADDER REMOVED  . Depression     PREVENTITIVE    Past Surgical History  Procedure Date  . Cholecystectomy     No family history on file. Social History:  reports that she has never smoked. She does not have any smokeless tobacco history on file. She reports that she drinks alcohol. She reports that she does not use illicit drugs.  Allergies: No Known Allergies  Medications Prior to Admission  Medication Dose Route Frequency Provider Last Rate Last Dose  . bacitracin 45409 UNITS injection           . ceFAZolin (ANCEF) IVPB 1 g/50 mL premix  1 g Intravenous 60 min Pre-Op Karn Cassis, MD      . HYDROmorphone (DILAUDID) injection 0.25-0.5 mg  0.25-0.5 mg Intravenous Q5 min PRN Aubery Lapping, MD      . HYDROmorphone (DILAUDID) injection 0.25-0.5 mg  0.25-0.5 mg Intravenous Q5 min PRN Aubery Lapping, MD      . meperidine (DEMEROL) injection 6.25-12.5 mg  6.25-12.5 mg Intravenous Q5 min PRN Aubery Lapping, MD      . meperidine (DEMEROL) injection 6.25-12.5 mg  6.25-12.5 mg Intravenous Q5 min PRN Aubery Lapping, MD      . morphine 2 MG/ML injection 0.05 mg/kg  0.05 mg/kg Intravenous Q10 min PRN Aubery Lapping, MD      . morphine 2 MG/ML injection 0.05 mg/kg  0.05 mg/kg Intravenous Q10 min PRN Aubery Lapping, MD      . ondansetron [facility]) injection 4 mg  4 mg Intravenous Once PRN Aubery Lapping, MD      . ondansetron [facility]) injection 4 mg  4 mg Intravenous Once PRN Aubery Lapping, MD      . sodium chloride 0.9 % infusion             Medications Prior to Admission  Medication Sig Dispense Refill  . FLUoxetine (PROZAC) 20 MG capsule Take 20 mg by mouth daily.      . folic acid (FOLVITE) 1 MG tablet Take 1 mg by mouth daily.      . Multiple Vitamins-Minerals (MULTIVITAMIN WITH MINERALS) tablet Take 1 tablet by mouth daily.      . predniSONE (DELTASONE) 20 MG tablet Take 3 tablets per day for 2 days, then 2 tablets per day for 2 days, then 1 tablet per day for 2 days.  12 tablet  0    No results found for this or any previous visit (from the past 48 hour(s)). No results found.  Review of Systems  Constitutional: Negative.   HENT: Positive for neck pain.   Eyes: Negative.   Respiratory: Negative.   Cardiovascular: Negative.   Gastrointestinal: Negative.   Genitourinary: Negative.   Skin: Negative.   Neurological: Positive for focal weakness.  Endo/Heme/Allergies: Negative.   Psychiatric/Behavioral: Negative.     Blood pressure 113/74, pulse 61, temperature 98.1 F (36.7 C), temperature source Oral, resp. rate 18, last menstrual period 10/01/2011, SpO2 98.00%. Physical Exam hent, nl. Neck, decrease  of flexibility . Lungs, clear. Cv, nl. Abdomren, nl. Extremities, nl. NEURO 2/5 WEAKNESS OF THE LEFT BICEPS. ABSENT LEFT TRICEPS REFLEX. MRI CERVICAL SPINE  LARGE HNP AT C5 6 WITH DISPLACEMENT OF CORD   Assessment/Plan   acd with plate at Z61   Suzanne Santos M 11/08/2011, 7:27 AM

## 2011-11-08 NOTE — Progress Notes (Signed)
acd at c56 was done, operative report [phone]

## 2011-11-08 NOTE — Transfer of Care (Signed)
Immediate Anesthesia Transfer of Care Note  Patient: Suzanne Santos  Procedure(s) Performed: Procedure(s) (LRB): ANTERIOR CERVICAL DECOMPRESSION/DISCECTOMY FUSION 1 LEVEL (N/A)  Patient Location: PACU  Anesthesia Type: General  Level of Consciousness: awake and alert   Airway & Oxygen Therapy: Patient Spontanous Breathing and Patient connected to nasal cannula oxygen  Post-op Assessment: Report given to PACU RN  Post vital signs: Reviewed and stable  Complications: No apparent anesthesia complications

## 2011-11-09 MED ORDER — DIAZEPAM 5 MG PO TABS: 5.0000 mg | ORAL_TABLET | Freq: Four times a day (QID) | ORAL | Status: AC | PRN

## 2011-11-09 MED ORDER — OXYCODONE-ACETAMINOPHEN 5-325 MG PO TABS: 1.0000 | ORAL_TABLET | ORAL | Status: AC | PRN

## 2011-11-09 NOTE — Op Note (Signed)
Suzanne Santos, Suzanne Santos              ACCOUNT NO.:  [id number]  MEDICAL RECORD NO.:  [id number]  LOCATION:  3537                         FACILITY:  [facility]  PHYSICIAN:  Hilda Lias, M.D.   DATE OF BIRTH:  Jan 23, 1981  DATE OF PROCEDURE:  11/08/2011 DATE OF DISCHARGE:                              OPERATIVE REPORT   PREOPERATIVE DIAGNOSIS:  C5-6 herniated disk, central to the left with acute on chronic radiculopathy.  POSTOPERATIVE DIAGNOSIS:  C5-6 herniated disk, central to the left with acute on chronic radiculopathy.  PROCEDURE:  Anterior 5-6 diskectomy, decompression of the spinal cord, interbody fusion with cage, plate, microscope.  SURGEON:  Hilda Lias, M.D.  ASSISTANT:  Kathaleen Maser. Pool, M.D.  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  The patient had conservative treatment.  Later on, the pain went down to the left upper extremity associated with weakness. Conservative treatment has been a failure.  MRI showed a large herniated disk, central to the left.  At the end, the patient wanted to proceed with surgery because she was unable to tolerate the pain.  The risks were explained to her in my office.  PROCEDURE:  The patient was taken to the OR, and after intubation, the left side of the neck was cleaned with DuraPrep.  Traction was applied to the left with 5 pounds.  The skin was cleaned with DuraPrep.  Drapes were applied.  Transverse incision was made through the skin, subcutaneous tissue, platysma down to the cervical spine.  First x-ray showed that we were at the level of C6-7.  From then on, we moved one space up, we opened anterior ligament at C5-6.  Large amount of degenerative disk were removed.  We went straight to the posterior ligament.  We opened the right side foramen.  In the left side, we found a large herniated disk, displacing the  spinal cord and compromise at C6 nerve root.  Decompression was achieved.  At the end, we had plenty of space for the spinal cord on both C6 nerve root.  Then, the endplate was drilled, a cage of 8 mm lordotic with autograft _and dbx_________ inside was inserted.  This was followed by a plate using 4 screws.  Two x-rays showed that we were at the level of 5-6 with a normal position of the cage and the plate.  From then on, the area was irrigated.  Hemostasis was accomplished, and the wound was closed with Vicryl and Steri-Strips.          ______________________________ Hilda Lias, M.D.    EB/MEDQ  D:  11/08/2011  T:  11/08/2011  Job:  [id number]

## 2011-11-09 NOTE — Discharge Summary (Signed)
Physician Discharge Summary  Patient ID: Suzanne Santos MRN: [id number] DOB/AGE: [birth year] 30 y.o.  Admit date: 11/08/2011 Discharge date: 11/09/2011  Admission Diagnoses: Cervical spondylosis with radiculopathy C5-C6  Discharge Diagnoses: Herbal spondylosis with radiculopathy C5-C6 Active Problems:  * No active hospital problems. *    Discharged Condition: good  Hospital Course: He was admitted to undergo anterior cervical decompression arthrodesis which he tolerated quite well at the level of C5-C6 she is discharged home area  Consults: None  Significant Diagnostic Studies: None    Discharge Exam: Blood pressure 106/65, pulse 71, temperature 98.5 F (36.9 C), temperature source Oral, resp. rate 20, last menstrual period 10/01/2011, SpO2 100.00%. Incision clean and dry motor function intact.  Disposition: 01-Home or Self Care  Discharge Orders    Future Orders Please Complete By Expires   Diet - low sodium heart healthy      Increase activity slowly      Discharge instructions      Comments:   Remove dressing in next day or 2. Okay to shower. No vigorous activity with upper extremities.   Call MD for:  temperature >100.4      Call MD for:  severe uncontrolled pain      Call MD for:  redness, tenderness, or signs of infection (pain, swelling, redness, odor or green/yellow discharge around incision site)        Medication List  As of 11/09/2011  9:31 AM   STOP taking these medications         diclofenac 75 MG EC tablet      ibuprofen 800 MG tablet         TAKE these medications         diazepam 5 MG tablet   Commonly known as: VALIUM   Take 1 tablet (5 mg total) by mouth every 6 (six) hours as needed (Muscle spasm).      FLUoxetine 20 MG capsule   Commonly known as: PROZAC   Take 20 mg by mouth daily.      folic acid 1 MG tablet   Commonly known as: FOLVITE   Take 1 mg by mouth daily.      meloxicam 15 MG tablet   Commonly known as: MOBIC   Take 15 mg by  mouth daily.      multivitamin with minerals tablet   Take 1 tablet by mouth daily.      OVER THE COUNTER MEDICATION   Apply 1 application topically 4 (four) times daily as needed. For pain  --homeopathic/herbal topical pain balm      oxyCODONE-acetaminophen 5-325 MG per tablet   Commonly known as: PERCOCET   Take 1-2 tablets by mouth every 4 (four) hours as needed for pain.      predniSONE 20 MG tablet   Commonly known as: DELTASONE   Take 3 tablets per day for 2 days, then 2 tablets per day for 2 days, then 1 tablet per day for 2 days.      traMADol 50 MG tablet   Commonly known as: ULTRAM   Take 50 mg by mouth every 6 (six) hours as needed.           Follow-up Information    Follow up with Karn Cassis, MD. Schedule an appointment as soon as possible for a visit in 3 weeks. (Call [facility] for appointment)    Contact information:   [address] [place] [address] [phone]  SignedStefani Dama 11/09/2011, 9:31 AM

## 2011-11-09 NOTE — Discharge Instructions (Signed)
Wound Care °Leave incision open to air. °You may shower. °Do not scrub directly on incision.  °Leave steri-strips on neck.  They will fall off themselves. °Do not put any creams, lotions, or ointments on incision. °Activity °Walk each and every day, increasing distance each day. °No lifting greater than 5 lbs.  Avoid excessive neck motion. °No driving for 2 weeks; may ride as a passenger locally. °Wear neck brace at all times except when showering.  If provided soft collar, may wear for comfort unless otherwise instructed. °Diet °Resume your normal diet.  °Return to Work °Will be discussed at you follow up appointment. °Call Your Doctor If Any of These Occur °Redness, drainage, or swelling at the wound.  °Temperature greater than 101 degrees. °Severe pain not relieved by pain medication. °Increased difficulty swallowing. °Incision starts to come apart. °Follow Up Appt °Call today for appointment in 3-4 weeks ([phone]) or for problems.  If you have any hardware placed in your spine, you will need an x-ray before your appointment. ° °

## 2011-11-13 ENCOUNTER — Encounter (HOSPITAL_COMMUNITY): Payer: Self-pay | Admitting: Neurosurgery

## 2012-04-30 ENCOUNTER — Encounter (HOSPITAL_COMMUNITY): Payer: Self-pay | Admitting: *Deleted

## 2012-04-30 ENCOUNTER — Emergency Department (HOSPITAL_COMMUNITY)
Admission: EM | Admit: 2012-04-30 | Discharge: 2012-04-30 | Disposition: A | Discharge status: Home / Self Care | Payer: 59 | Attending: Emergency Medicine | Admitting: Emergency Medicine

## 2012-04-30 ENCOUNTER — Emergency Department (HOSPITAL_COMMUNITY): Payer: 59

## 2012-04-30 DIAGNOSIS — R079 Chest pain, unspecified: Secondary | ICD-10-CM | POA: Insufficient documentation

## 2012-04-30 LAB — BASIC METABOLIC PANEL
CO2: 22 mEq/L (ref 19–32)
Chloride: 101 mEq/L (ref 96–112)
Sodium: 133 mEq/L — ABNORMAL LOW (ref 135–145)

## 2012-04-30 LAB — CBC
MCV: 91 fL (ref 78.0–100.0)
Platelets: 266 10*3/uL (ref 150–400)
RBC: 3.45 MIL/uL — ABNORMAL LOW (ref 3.87–5.11)
WBC: 11.9 10*3/uL — ABNORMAL HIGH (ref 4.0–10.5)

## 2012-04-30 LAB — TROPONIN I: Troponin I: <0.3 ng/mL (ref <0.30)

## 2012-04-30 NOTE — ED Provider Notes (Signed)
History   This chart was scribed for Donnetta Hutching, MD by Sofie Rower. The patient was seen in room APA18/APA18 and the patient's care was started at 3:41PM    CSN: [id number]  Arrival date & time 04/30/12  1431   First MD Initiated Contact with Patient 04/30/12 1505      Chief Complaint  Patient presents with  . Chest Pain    (Consider location/radiation/quality/duration/timing/severity/associated sxs/prior treatment) Patient is a 31 y.o. female presenting with chest pain. The history is provided by the patient. No language interpreter was used.  Chest Pain     JILLAINE WAREN is a 31 y.o. female who presents to the Emergency Department complaining of   Sudden, progressively worsening, chest pain, located at the left side of the chest, onset today (1:15PM), radiating towards the anterior left shoulder with associated symptoms of nausea, and vomiting. The pt describes the chest pain as an aching, dull pain, which becomes sharp as she breathes deeply. Modifying factors include taking a deep breath which intensifies the chest pain.The pt reports she is pregnant [redacted] weeks, and 2 days.  The pt has a hx of cholecystectomy (2002), spinal effusion ( located at C5 and C6, performed by Dr. Jeral Fruit on 11/08/11, Neurosurgeon, [facility]) and a familial hx of MI (father age 29). The pt denies dizziness, shortness of breath, hypertension, diabetes, cramping, and vaginal bleeding.   The pt does not smoke, however, drinks alcohol occasionally. The pt is employed as a Production manager.   PCP is Dr. Olena Leatherwood. OB/ GYN is Dr. Claiborne Billings.   Past Medical History  Diagnosis Date  . PONV (postoperative nausea and vomiting)     AFTER GALLBLADDER REMOVED  . Depression     PREVENTITIVE    Past Surgical History  Procedure Date  . Cholecystectomy   . Anterior cervical decomp/discectomy fusion 11/08/2011    Procedure: ANTERIOR CERVICAL DECOMPRESSION/DISCECTOMY FUSION 1 LEVEL;  Surgeon: Karn Cassis, MD;  Location: [facility];  Service: Neurosurgery;  Laterality: N/A;  Cervical Five-Six Anterior Cervical decompression/diskectomy fusion    History reviewed. No pertinent family history.  History  Substance Use Topics  . Smoking status: Never Smoker   . Smokeless tobacco: Not on file  . Alcohol Use: No     occassionally    OB History    Grav Para Term Preterm Abortions TAB SAB Ect Mult Living   1               Review of Systems  Cardiovascular: Positive for chest pain.  All other systems reviewed and are negative.    Allergies  Review of patient's allergies indicates no known allergies.  Home Medications   Current Outpatient Rx  Name Route Sig Dispense Refill  . FOLIC ACID 1 MG PO TABS Oral Take 1 mg by mouth daily.    . MELOXICAM 15 MG PO TABS Oral Take 15 mg by mouth daily.    . MULTI-VITAMIN/MINERALS PO TABS Oral Take 1 tablet by mouth daily.    Marland Kitchen OVER THE COUNTER MEDICATION Topical Apply 1 application topically 4 (four) times daily as needed. For pain  --homeopathic/herbal topical pain balm    . PREDNISONE 20 MG PO TABS  Take 3 tablets per day for 2 days, then 2 tablets per day for 2 days, then 1 tablet per day for 2 days. 12 tablet 0  . TRAMADOL HCL 50 MG PO TABS Oral Take 50 mg by mouth every 6 (six) hours  as needed.      BP 130/73  Pulse 81  Temp 98.1 F (36.7 C) (Oral)  Resp 20  Ht 5\' 7"  (1.702 m)  Wt 178 lb (80.74 kg)  BMI 27.88 kg/m2  SpO2 97%  LMP 01/22/2012  Physical Exam  Nursing note and vitals reviewed. Constitutional: She is oriented to person, place, and time. She appears well-developed and well-nourished.  HENT:  Head: Atraumatic.  Right Ear: External ear normal.  Left Ear: External ear normal.  Nose: Nose normal.  Eyes: Conjunctivae are normal. Pupils are equal, round, and reactive to light.  Neck: Normal range of motion.  Cardiovascular: Normal rate, regular rhythm and normal heart sounds.   Pulmonary/Chest: Effort normal  and breath sounds normal. She exhibits no tenderness.  Abdominal: Soft. Bowel sounds are normal.  Musculoskeletal: Normal range of motion.  Neurological: She is alert and oriented to person, place, and time.  Skin: Skin is warm and dry.  Psychiatric: She has a normal mood and affect. Her behavior is normal.    ED Course  Procedures (including critical care time)  DIAGNOSTIC STUDIES: Oxygen Saturation is 97% on [place], normal by my interpretation.    COORDINATION OF CARE:    3:45PM- Heart disease risk factors, laboratory examinations discussed. Pt agrees to treatment.   5:24PM- Recheck. Laboratory results, elimination of possible Pulmonary Embolism, and discharge discussed.   Results for orders placed during the hospital encounter of 04/30/12  CBC      Component Value Range   WBC 11.9 (*) 4.0 - 10.5 K/uL   RBC 3.45 (*) 3.87 - 5.11 MIL/uL   Hemoglobin 10.8 (*) 12.0 - 15.0 g/dL   HCT 54.0 (*) 98.1 - 19.1 %   MCV 91.0  78.0 - 100.0 fL   MCH 31.3  26.0 - 34.0 pg   MCHC 34.4  30.0 - 36.0 g/dL   RDW 47.8  29.5 - 62.1 %   Platelets 266  150 - 400 K/uL  BASIC METABOLIC PANEL      Component Value Range   Sodium 133 (*) 135 - 145 mEq/L   Potassium 3.8  3.5 - 5.1 mEq/L   Chloride 101  96 - 112 mEq/L   CO2 22  19 - 32 mEq/L   Glucose, Bld 88  70 - 99 mg/dL   BUN 12  6 - 23 mg/dL   Creatinine, Ser 3.08  0.50 - 1.10 mg/dL   Calcium 9.7  8.4 - 65.7 mg/dL   GFR calc non Af Amer >90  >90 mL/min   GFR calc Af Amer >90  >90 mL/min  TROPONIN I      Component Value Range   Troponin I <0.30  <0.30 ng/mL   Dg Chest Port 1 View  04/30/2012  *RADIOLOGY REPORT*  Clinical Data: Left-sided chest pain  PORTABLE CHEST - 1 VIEW  Comparison: None.  Findings: Normal heart size.  Clear lungs.  Mild bronchitic changes.  No pneumothorax.  IMPRESSION: Bronchitic changes.   Original Report Authenticated By: Donavan Burnet, M.D.       No diagnosis found.   Date: 04/30/2012  Rate: 82  Rhythm: normal  sinus rhythm  QRS Axis: normal  Intervals: normal  ST/T Wave abnormalities: normal  Conduction Disutrbances: none  Narrative Interpretation: unremarkable      MDM  PERC score zero.  Extremely low risk for acute coronary syndrome. Normal vital signs. EKG and troponin negative. Chest x-ray normal. Discharge home      I personally performed  the services described in this documentation, which was scribed in my presence. The recorded information has been reviewed and considered.    Donnetta Hutching, MD 04/30/12 1730

## 2012-04-30 NOTE — ED Notes (Addendum)
Pt c/o left chest pain radiating through to her back and shoulder since 1:15. Pt is [redacted] weeks pregnant. Also vomited x 2. Hurts worse with deep breathing.

## 2012-09-09 NOTE — L&D Delivery Note (Signed)
Patient was C/C/+3 and pushed for 5 minutes with epidural.   NSVD  female infant, Apgars 9,9, weight 9#1.   The patient had one second degree midline perineal laceration repaired with 2-0 vicryl R. Fundus was firm. EBL was expected. Placenta was delivered intact. Vagina was clear.  Baby was vigorous to bedside.  Ilma Achee A

## 2012-10-02 ENCOUNTER — Encounter (HOSPITAL_COMMUNITY): Payer: Self-pay | Admitting: *Deleted

## 2012-10-02 ENCOUNTER — Inpatient Hospital Stay (HOSPITAL_COMMUNITY)
Admission: AD | Admit: 2012-10-02 | Discharge: 2012-10-02 | Disposition: A | Discharge status: Home / Self Care | Payer: 59 | Source: Ambulatory Visit | Attending: Obstetrics and Gynecology | Admitting: Obstetrics and Gynecology

## 2012-10-02 ENCOUNTER — Inpatient Hospital Stay (HOSPITAL_COMMUNITY)
Admission: AD | Admit: 2012-10-02 | Discharge: 2012-10-02 | Disposition: A | Discharge status: Home / Self Care | Payer: 59 | Source: Ambulatory Visit | Attending: Obstetrics & Gynecology | Admitting: Obstetrics & Gynecology

## 2012-10-02 DIAGNOSIS — O4700 False labor before 37 completed weeks of gestation, unspecified trimester: Secondary | ICD-10-CM | POA: Insufficient documentation

## 2012-10-02 DIAGNOSIS — O139 Gestational [pregnancy-induced] hypertension without significant proteinuria, unspecified trimester: Secondary | ICD-10-CM | POA: Insufficient documentation

## 2012-10-02 HISTORY — DX: Other complications of anesthesia, initial encounter: T88.59XA

## 2012-10-02 HISTORY — DX: Adverse effect of unspecified anesthetic, initial encounter: T41.45XA

## 2012-10-02 LAB — URINALYSIS, ROUTINE W REFLEX MICROSCOPIC
Glucose, UA: NEGATIVE mg/dL
Hgb urine dipstick: NEGATIVE
Leukocytes, UA: NEGATIVE
Nitrite: NEGATIVE
Protein, ur: NEGATIVE mg/dL
Specific Gravity, Urine: <1.005 — ABNORMAL LOW (ref 1.005–1.030)
Specific Gravity, Urine: 1.01 (ref 1.005–1.030)
pH: 6 (ref 5.0–8.0)
pH: 6 (ref 5.0–8.0)

## 2012-10-02 LAB — URINE MICROSCOPIC-ADD ON

## 2012-10-02 MED ORDER — ACETAMINOPHEN 325 MG PO TABS
650.0000 mg | ORAL_TABLET | Freq: Once | ORAL | Status: AC
  Administered 2012-10-02: 650 mg via ORAL
  Filled 2012-10-02: qty 2

## 2012-10-02 MED ORDER — NIFEDIPINE 10 MG PO CAPS
10.0000 mg | ORAL_CAPSULE | Freq: Once | ORAL | Status: AC
  Administered 2012-10-02: 10 mg via ORAL
  Filled 2012-10-02: qty 1

## 2012-10-02 NOTE — MAU Note (Signed)
Pt states was seen this am for labor eval, sent home, was given procardia in MAU. Had h/a on way home, became worse, checked bp at home and was 160's/90's.

## 2012-10-02 NOTE — MAU Provider Note (Signed)
Chief Complaint:  Headache and Hypertension   First Provider Initiated Contact with Patient 10/02/12 2002      HPI: Suzanne Santos is a 32 y.o. G2P1001 at [redacted]w[redacted]d who presents to maternity admissions reporting hypertension and headache. The patient was evaluated for labor check this morning in MAU. Sent home with procardia. Patient states that when she got home she had a headache. She took tylenol around 9 am and tried to sleep, but wasn't really able to get much rest. The patient states that her headache is 5/10 on the pain scale right now. She denies N/V, RUQ pain, changes in vision, blurred vision or increasing peripheral edema. She is having occasional contractions, which are unchanged in severity since this morning. She denies vaginal bleeding, LOF. Reports good fetal movement.   Past Medical History: Past Medical History  Diagnosis Date  . Depression     PREVENTITIVE  . Complication of anesthesia   . PONV (postoperative nausea and vomiting)     Past obstetric history: OB History    Grav Para Term Preterm Abortions TAB SAB Ect Mult Living   2 1 1       1      # Outc Date GA Lbr Len/2nd Wgt Sex Del Anes PTL Lv   1 TRM            2 CUR               Past Surgical History: Past Surgical History  Procedure Date  . Cholecystectomy   . Anterior cervical decomp/discectomy fusion 11/08/2011    Procedure: ANTERIOR CERVICAL DECOMPRESSION/DISCECTOMY FUSION 1 LEVEL;  Surgeon: Karn Cassis, MD;  Location: [facility];  Service: Neurosurgery;  Laterality: N/A;  Cervical Five-Six Anterior Cervical decompression/diskectomy fusion    Family History: History reviewed. No pertinent family history.  Social History: History  Substance Use Topics  . Smoking status: Never Smoker   . Smokeless tobacco: Not on file  . Alcohol Use: No     Comment: occassionally    Allergies: No Known Allergies  Meds:  No prescriptions prior to admission    ROS: Pertinent findings in history of  present illness.  Physical Exam  Blood pressure 137/83, pulse 68, temperature 98.5 F (36.9 C), temperature source Oral, resp. rate 18, height 5\' 7"  (1.702 m), weight 213 lb (96.616 kg), last menstrual period 01/22/2012. GENERAL: Well-developed, well-nourished female in no acute distress.  HEENT: normocephalic HEART: normal rate and rhythm RESP: normal effort, clear to ascultation ABDOMEN: Soft, non-tender, gravid appropriate for gestational age. No RUQ tenderness.  EXTREMITIES: Nontender, 1+ pitting edema, 2+ reflexes, no clonus NEURO: alert and oriented    FHT:  Baseline 130 , moderate variability, accelerations present, no decelerations Contractions: occasional   Serial BPs: 124/86 134/87 150/77 143/81  Labs: Results for orders placed during the hospital encounter of 10/02/12 (from the past 24 hour(s))  URINALYSIS, ROUTINE W REFLEX MICROSCOPIC     Status: Abnormal   Collection Time   10/02/12  7:10 PM      Component Value Range   Color, Urine YELLOW  YELLOW   APPearance CLEAR  CLEAR   Specific Gravity, Urine 1.010  1.005 - 1.030   pH 6.0  5.0 - 8.0   Glucose, UA NEGATIVE  NEGATIVE mg/dL   Hgb urine dipstick NEGATIVE  NEGATIVE   Bilirubin Urine NEGATIVE  NEGATIVE   Ketones, ur NEGATIVE  NEGATIVE mg/dL   Protein, ur NEGATIVE  NEGATIVE mg/dL   Urobilinogen, UA 0.2  0.0 - 1.0 mg/dL   Nitrite NEGATIVE  NEGATIVE   Leukocytes, UA SMALL (*) NEGATIVE  URINE MICROSCOPIC-ADD ON     Status: Abnormal   Collection Time   10/02/12  7:10 PM      Component Value Range   Squamous Epithelial / LPF FEW (*) RARE   WBC, UA 3-6  <3 WBC/hpf   RBC / HPF 0-2  <3 RBC/hpf   Bacteria, UA FEW (*) RARE    MAU Course: Discussed patient with Dr. Arlyce Dice. He recommends discharging patient at this time. She should no longer take the procardia, discuss labor precautions and symptoms of pre-eclampsia. Follow-up in the office early next week.   Assessment: 1. Gestational hypertension      Plan: Discharge home Labor precautions and fetal kick counts Discussed s/s of pre-eclampsia Recommended patient no longer take the procardia Follow-up with Dr. Arlyce Dice early next week. Patient has appointment Tuesday, will call office Monday to confirm.  Patient may return to MAU as needed or if condition should change or worsen.       Follow-up Information    Call Mickel Baas, MD. (get an appointment early next week)    Contact information:   [address] [place] [address] [phone]           Medication List     As of 10/02/2012 11:50 PM    TAKE these medications         cetirizine 10 MG tablet   Commonly known as: ZYRTEC   Take 10 mg by mouth daily.      ferrous sulfate 325 (65 FE) MG tablet   Take 325 mg by mouth daily with breakfast.      FLUoxetine 40 MG capsule   Commonly known as: PROZAC   Take 40 mg by mouth daily.      ondansetron 4 MG tablet   Commonly known as: ZOFRAN   Take 4 mg by mouth every 8 (eight) hours as needed.      PRENATAL PO   Take 2 tablets by mouth daily. CHEWABLE w/FIBER and CALCIUM      traMADol 50 MG tablet   Commonly known as: ULTRAM   Take 50 mg by mouth every 6 (six) hours as needed.         Freddi Starr, PA-C 10/02/2012 11:50 PM

## 2012-10-02 NOTE — MAU Note (Signed)
Pt states tht she has had contractions all night at work-has gotten more and more frequent

## 2012-10-02 NOTE — Progress Notes (Signed)
Dr Arlyce Dice notified of pt's change in fetal heart rate, contraction pattern, states he will review strip and call back.

## 2012-10-02 NOTE — MAU Note (Signed)
J. Either PA at the bedside.

## 2012-10-02 NOTE — Progress Notes (Signed)
Dr Arlyce Dice returned call and states that FHR look fine and pt can be discharged home

## 2012-10-04 LAB — URINE CULTURE: Colony Count: >=100000

## 2012-10-17 ENCOUNTER — Encounter (HOSPITAL_COMMUNITY): Payer: Self-pay | Admitting: Family

## 2012-10-17 ENCOUNTER — Inpatient Hospital Stay (HOSPITAL_COMMUNITY)
Admission: AD | Admit: 2012-10-17 | Discharge: 2012-10-17 | Disposition: A | Discharge status: Home / Self Care | Payer: 59 | Source: Ambulatory Visit | Attending: Obstetrics and Gynecology | Admitting: Obstetrics and Gynecology

## 2012-10-17 DIAGNOSIS — A6000 Herpesviral infection of urogenital system, unspecified: Secondary | ICD-10-CM | POA: Insufficient documentation

## 2012-10-17 DIAGNOSIS — O98519 Other viral diseases complicating pregnancy, unspecified trimester: Secondary | ICD-10-CM | POA: Insufficient documentation

## 2012-10-17 DIAGNOSIS — O479 False labor, unspecified: Secondary | ICD-10-CM | POA: Insufficient documentation

## 2012-10-17 HISTORY — DX: Herpesviral infection, unspecified: B00.9

## 2012-10-17 NOTE — MAU Note (Signed)
Patient presents to MAU with c/o contractions every 5-10 minutes since 0800 today; denies LOF, vaginal bleeding. Reports + fetal movement.  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

## 2012-10-17 NOTE — MAU Note (Signed)
Contractions on and off for awhile. Started with ctxs this am and feel different. Ctxs every 5-49mins apart.

## 2012-10-17 NOTE — MAU Note (Signed)
Second Reviewer: Perrin Smack, RN

## 2012-10-20 ENCOUNTER — Inpatient Hospital Stay (HOSPITAL_COMMUNITY): Payer: 59 | Admitting: Anesthesiology

## 2012-10-20 ENCOUNTER — Inpatient Hospital Stay (HOSPITAL_COMMUNITY)
Admission: AD | Admit: 2012-10-20 | Discharge: 2012-10-22 | DRG: 774 | Disposition: A | Discharge status: Home / Self Care | Payer: 59 | Source: Ambulatory Visit | Attending: Obstetrics and Gynecology | Admitting: Obstetrics and Gynecology

## 2012-10-20 ENCOUNTER — Encounter (HOSPITAL_COMMUNITY): Payer: Self-pay | Admitting: Anesthesiology

## 2012-10-20 ENCOUNTER — Encounter (HOSPITAL_COMMUNITY): Payer: Self-pay | Admitting: *Deleted

## 2012-10-20 DIAGNOSIS — Z3480 Encounter for supervision of other normal pregnancy, unspecified trimester: Secondary | ICD-10-CM

## 2012-10-20 DIAGNOSIS — IMO0002 Reserved for concepts with insufficient information to code with codable children: Principal | ICD-10-CM | POA: Diagnosis present

## 2012-10-20 HISTORY — DX: Female infertility, unspecified: N97.9

## 2012-10-20 LAB — CBC
Platelets: 181 10*3/uL (ref 150–400)
Platelets: 183 10*3/uL (ref 150–400)
RBC: 3.68 MIL/uL — ABNORMAL LOW (ref 3.87–5.11)
RBC: 3.34 MIL/uL — ABNORMAL LOW (ref 3.87–5.11)
RDW: 15.3 % (ref 11.5–15.5)
WBC: 10.6 10*3/uL — ABNORMAL HIGH (ref 4.0–10.5)
WBC: 16 10*3/uL — ABNORMAL HIGH (ref 4.0–10.5)

## 2012-10-20 LAB — COMPREHENSIVE METABOLIC PANEL
ALT: 12 U/L (ref 0–35)
AST: 18 U/L (ref 0–37)
Albumin: 2.3 g/dL — ABNORMAL LOW (ref 3.5–5.2)
Alkaline Phosphatase: 257 U/L — ABNORMAL HIGH (ref 39–117)
BUN: 11 mg/dL (ref 6–23)
Chloride: 102 mEq/L (ref 96–112)
Potassium: 4 mEq/L (ref 3.5–5.1)
Sodium: 136 mEq/L (ref 135–145)
Total Protein: 6.3 g/dL (ref 6.0–8.3)

## 2012-10-20 LAB — OB RESULTS CONSOLE GC/CHLAMYDIA
Chlamydia: NEGATIVE
Gonorrhea: NEGATIVE

## 2012-10-20 LAB — OB RESULTS CONSOLE ABO/RH

## 2012-10-20 LAB — OB RESULTS CONSOLE GBS: GBS: NEGATIVE

## 2012-10-20 LAB — OB RESULTS CONSOLE HIV ANTIBODY (ROUTINE TESTING): HIV: NONREACTIVE

## 2012-10-20 LAB — OB RESULTS CONSOLE RUBELLA ANTIBODY, IGM: Rubella: IMMUNE

## 2012-10-20 LAB — RPR: RPR Ser Ql: NONREACTIVE

## 2012-10-20 MED ORDER — EPHEDRINE 5 MG/ML INJ: 10.0000 mg | INTRAVENOUS | Status: DC | PRN

## 2012-10-20 MED ORDER — METHYLERGONOVINE MALEATE 0.2 MG PO TABS: 0.2000 mg | ORAL_TABLET | ORAL | Status: DC | PRN

## 2012-10-20 MED ORDER — PHENYLEPHRINE 40 MCG/ML (10ML) SYRINGE FOR IV PUSH (FOR BLOOD PRESSURE SUPPORT): 80.0000 ug | PREFILLED_SYRINGE | INTRAVENOUS | Status: DC | PRN

## 2012-10-20 MED ORDER — SODIUM CHLORIDE 0.9 % IJ SOLN
3.0000 mL | INTRAMUSCULAR | Status: DC | PRN
  Administered 2012-10-21: 3 mL via INTRAVENOUS

## 2012-10-20 MED ORDER — PHENYLEPHRINE 40 MCG/ML (10ML) SYRINGE FOR IV PUSH (FOR BLOOD PRESSURE SUPPORT)
80.0000 ug | PREFILLED_SYRINGE | INTRAVENOUS | Status: DC | PRN
  Filled 2012-10-20: qty 5

## 2012-10-20 MED ORDER — LANOLIN HYDROUS EX OINT: TOPICAL_OINTMENT | CUTANEOUS | Status: DC | PRN

## 2012-10-20 MED ORDER — DIPHENHYDRAMINE HCL 50 MG/ML IJ SOLN: 12.5000 mg | INTRAMUSCULAR | Status: DC | PRN

## 2012-10-20 MED ORDER — ONDANSETRON HCL 4 MG/2ML IJ SOLN: 4.0000 mg | Freq: Four times a day (QID) | INTRAMUSCULAR | Status: DC | PRN

## 2012-10-20 MED ORDER — PRENATAL MULTIVITAMIN CH
1.0000 | ORAL_TABLET | Freq: Every day | ORAL | Status: DC
  Administered 2012-10-21 – 2012-10-22 (×2): 1 via ORAL
  Filled 2012-10-20 (×2): qty 1

## 2012-10-20 MED ORDER — OXYTOCIN 40 UNITS IN LACTATED RINGERS INFUSION - SIMPLE MED
INTRAVENOUS | Status: AC
  Administered 2012-10-20: 2 m[IU]/min via INTRAVENOUS
  Filled 2012-10-20: qty 1000

## 2012-10-20 MED ORDER — OXYCODONE-ACETAMINOPHEN 5-325 MG PO TABS
1.0000 | ORAL_TABLET | ORAL | Status: DC | PRN
  Administered 2012-10-20 – 2012-10-22 (×6): 1 via ORAL
  Filled 2012-10-20 (×6): qty 1

## 2012-10-20 MED ORDER — LIDOCAINE HCL (PF) 1 % IJ SOLN
30.0000 mL | INTRAMUSCULAR | Status: DC | PRN
  Filled 2012-10-20: qty 30

## 2012-10-20 MED ORDER — OXYTOCIN 40 UNITS IN LACTATED RINGERS INFUSION - SIMPLE MED
1.0000 m[IU]/min | INTRAVENOUS | Status: DC
  Administered 2012-10-20: 2 m[IU]/min via INTRAVENOUS
  Administered 2012-10-20: 666 m[IU]/min via INTRAVENOUS

## 2012-10-20 MED ORDER — ZOLPIDEM TARTRATE 5 MG PO TABS: 5.0000 mg | ORAL_TABLET | Freq: Every evening | ORAL | Status: DC | PRN

## 2012-10-20 MED ORDER — FENTANYL 2.5 MCG/ML BUPIVACAINE 1/10 % EPIDURAL INFUSION (WH - ANES)
14.0000 mL/h | INTRAMUSCULAR | Status: DC
  Administered 2012-10-20: 14 mL/h via EPIDURAL
  Filled 2012-10-20: qty 125

## 2012-10-20 MED ORDER — FERROUS SULFATE 325 (65 FE) MG PO TABS
325.0000 mg | ORAL_TABLET | Freq: Two times a day (BID) | ORAL | Status: DC
  Administered 2012-10-21 – 2012-10-22 (×3): 325 mg via ORAL
  Filled 2012-10-20 (×3): qty 1

## 2012-10-20 MED ORDER — OXYCODONE-ACETAMINOPHEN 5-325 MG PO TABS: 1.0000 | ORAL_TABLET | ORAL | Status: DC | PRN

## 2012-10-20 MED ORDER — SENNOSIDES-DOCUSATE SODIUM 8.6-50 MG PO TABS
2.0000 | ORAL_TABLET | Freq: Every day | ORAL | Status: DC
  Administered 2012-10-21: 2 via ORAL

## 2012-10-20 MED ORDER — LACTATED RINGERS IV SOLN: 500.0000 mL | INTRAVENOUS | Status: DC | PRN

## 2012-10-20 MED ORDER — METHYLERGONOVINE MALEATE 0.2 MG/ML IJ SOLN: 0.2000 mg | INTRAMUSCULAR | Status: DC | PRN

## 2012-10-20 MED ORDER — LACTATED RINGERS IV SOLN: 500.0000 mL | Freq: Once | INTRAVENOUS | Status: DC

## 2012-10-20 MED ORDER — LACTATED RINGERS IV SOLN
INTRAVENOUS | Status: DC
  Administered 2012-10-20: 12:00:00 via INTRAVENOUS

## 2012-10-20 MED ORDER — ACETAMINOPHEN 325 MG PO TABS
650.0000 mg | ORAL_TABLET | ORAL | Status: DC | PRN
  Administered 2012-10-20: 650 mg via ORAL
  Filled 2012-10-20: qty 2

## 2012-10-20 MED ORDER — LIDOCAINE HCL (PF) 1 % IJ SOLN
INTRAMUSCULAR | Status: DC | PRN
  Administered 2012-10-20 (×4): 4 mL

## 2012-10-20 MED ORDER — BENZOCAINE-MENTHOL 20-0.5 % EX AERO
1.0000 "application " | INHALATION_SPRAY | CUTANEOUS | Status: DC | PRN
  Filled 2012-10-20: qty 56

## 2012-10-20 MED ORDER — SIMETHICONE 80 MG PO CHEW: 80.0000 mg | CHEWABLE_TABLET | ORAL | Status: DC | PRN

## 2012-10-20 MED ORDER — SODIUM CHLORIDE 0.9 % IJ SOLN
3.0000 mL | Freq: Two times a day (BID) | INTRAMUSCULAR | Status: DC
  Administered 2012-10-21: 3 mL via INTRAVENOUS

## 2012-10-20 MED ORDER — CITRIC ACID-SODIUM CITRATE 334-500 MG/5ML PO SOLN: 30.0000 mL | ORAL | Status: DC | PRN

## 2012-10-20 MED ORDER — TETANUS-DIPHTH-ACELL PERTUSSIS 5-2.5-18.5 LF-MCG/0.5 IM SUSP
0.5000 mL | Freq: Once | INTRAMUSCULAR | Status: AC
  Administered 2012-10-22: 0.5 mL via INTRAMUSCULAR

## 2012-10-20 MED ORDER — WITCH HAZEL-GLYCERIN EX PADS: 1.0000 "application " | MEDICATED_PAD | CUTANEOUS | Status: DC | PRN

## 2012-10-20 MED ORDER — LORATADINE 10 MG PO TABS
10.0000 mg | ORAL_TABLET | Freq: Every day | ORAL | Status: DC
  Administered 2012-10-21 – 2012-10-22 (×2): 10 mg via ORAL
  Filled 2012-10-20 (×3): qty 1

## 2012-10-20 MED ORDER — DIBUCAINE 1 % RE OINT: 1.0000 "application " | TOPICAL_OINTMENT | RECTAL | Status: DC | PRN

## 2012-10-20 MED ORDER — MEASLES, MUMPS & RUBELLA VAC ~~LOC~~ INJ: 0.5000 mL | INJECTION | Freq: Once | SUBCUTANEOUS | Status: DC

## 2012-10-20 MED ORDER — SODIUM CHLORIDE 0.9 % IV SOLN: 250.0000 mL | INTRAVENOUS | Status: DC | PRN

## 2012-10-20 MED ORDER — ONDANSETRON HCL 4 MG/2ML IJ SOLN: 4.0000 mg | INTRAMUSCULAR | Status: DC | PRN

## 2012-10-20 MED ORDER — EPHEDRINE 5 MG/ML INJ
10.0000 mg | INTRAVENOUS | Status: DC | PRN
  Filled 2012-10-20: qty 4

## 2012-10-20 MED ORDER — DIPHENHYDRAMINE HCL 25 MG PO CAPS: 25.0000 mg | ORAL_CAPSULE | Freq: Four times a day (QID) | ORAL | Status: DC | PRN

## 2012-10-20 MED ORDER — IBUPROFEN 800 MG PO TABS
800.0000 mg | ORAL_TABLET | Freq: Three times a day (TID) | ORAL | Status: DC
  Administered 2012-10-20 – 2012-10-22 (×4): 800 mg via ORAL
  Filled 2012-10-20 (×5): qty 1

## 2012-10-20 MED ORDER — TERBUTALINE SULFATE 1 MG/ML IJ SOLN: 0.2500 mg | Freq: Once | INTRAMUSCULAR | Status: DC | PRN

## 2012-10-20 MED ORDER — OXYTOCIN BOLUS FROM INFUSION: 500.0000 mL | INTRAVENOUS | Status: DC

## 2012-10-20 MED ORDER — IBUPROFEN 600 MG PO TABS: 600.0000 mg | ORAL_TABLET | Freq: Four times a day (QID) | ORAL | Status: DC | PRN

## 2012-10-20 MED ORDER — OXYTOCIN 40 UNITS IN LACTATED RINGERS INFUSION - SIMPLE MED: 62.5000 mL/h | INTRAVENOUS | Status: DC

## 2012-10-20 MED ORDER — BUTORPHANOL TARTRATE 1 MG/ML IJ SOLN: 1.0000 mg | INTRAMUSCULAR | Status: DC | PRN

## 2012-10-20 MED ORDER — FLUOXETINE HCL 20 MG PO CAPS
40.0000 mg | ORAL_CAPSULE | Freq: Every day | ORAL | Status: DC
  Administered 2012-10-21 – 2012-10-22 (×2): 40 mg via ORAL
  Filled 2012-10-20 (×3): qty 2

## 2012-10-20 MED ORDER — MAGNESIUM HYDROXIDE 400 MG/5ML PO SUSP: 30.0000 mL | ORAL | Status: DC | PRN

## 2012-10-20 MED ORDER — ONDANSETRON HCL 4 MG PO TABS: 4.0000 mg | ORAL_TABLET | ORAL | Status: DC | PRN

## 2012-10-20 NOTE — Anesthesia Procedure Notes (Signed)
Epidural Patient location during procedure: OB Start time: 10/20/2012 3:25 PM  Staffing Performed by: anesthesiologist   Preanesthetic Checklist Completed: patient identified, site marked, surgical consent, pre-op evaluation, timeout performed, IV checked, risks and benefits discussed and monitors and equipment checked  Epidural Patient position: sitting Prep: site prepped and draped and DuraPrep Patient monitoring: continuous pulse ox and blood pressure Approach: midline Injection technique: LOR air  Needle:  Needle type: Tuohy  Needle gauge: 17 G Needle length: 9 cm and 9 Needle insertion depth: 5 cm cm Catheter type: closed end flexible Catheter size: 19 Gauge Catheter at skin depth: 10 cm Test dose: negative  Assessment Events: blood not aspirated, injection not painful, no injection resistance, negative IV test and no paresthesia  Additional Notes Discussed risk of headache, infection, bleeding, nerve injury and failed or incomplete block.  Patient voices understanding and wishes to proceed.  Epidural placed easily on first attempt.  No paresthesia.  Patient tolerated procedure well with no apparent complications.  Jasmine December, MD Reason for block:procedure for pain

## 2012-10-20 NOTE — Anesthesia Preprocedure Evaluation (Addendum)
Anesthesia Evaluation  Patient identified by MRN, date of birth, ID band Patient awake    Reviewed: Allergy & Precautions, H&P , NPO status , Patient's Chart, lab work & pertinent test results, reviewed documented beta blocker date and time   History of Anesthesia Complications (+) PONV  Airway Mallampati: I TM Distance: >3 FB Neck ROM: full    Dental  (+) Teeth Intact   Pulmonary neg pulmonary ROS,  breath sounds clear to auscultation        Cardiovascular hypertension (preeclampsia), Rhythm:regular Rate:Normal     Neuro/Psych PSYCHIATRIC DISORDERS (depression)    GI/Hepatic negative GI ROS, Neg liver ROS,   Endo/Other  negative endocrine ROS  Renal/GU negative Renal ROS  negative genitourinary   Musculoskeletal   Abdominal   Peds  Hematology  (+) anemia ,   Anesthesia Other Findings   Reproductive/Obstetrics (+) Pregnancy                          Anesthesia Physical Anesthesia Plan  ASA: III  Anesthesia Plan: Epidural   Post-op Pain Management:    Induction:   Airway Management Planned:   Additional Equipment:   Intra-op Plan:   Post-operative Plan:   Informed Consent: I have reviewed the patients History and Physical, chart, labs and discussed the procedure including the risks, benefits and alternatives for the proposed anesthesia with the patient or authorized representative who has indicated his/her understanding and acceptance.     Plan Discussed with:   Anesthesia Plan Comments:        Anesthesia Quick Evaluation

## 2012-10-20 NOTE — H&P (Signed)
32 y.o. [redacted]w[redacted]d  G2P1001 comes in for mild preeclampsia.  Found to have 1+ proteinuria and elevated BP in office.  Otherwise has good fetal movement and no bleeding.  Past Medical History  Diagnosis Date  . Depression     PREVENTITIVE  . Complication of anesthesia   . PONV (postoperative nausea and vomiting)   . HSV (herpes simplex virus) infection     treated with Valtrex    Past Surgical History  Procedure Laterality Date  . Cholecystectomy    . Anterior cervical decomp/discectomy fusion  11/08/2011    Procedure: ANTERIOR CERVICAL DECOMPRESSION/DISCECTOMY FUSION 1 LEVEL;  Surgeon: Karn Cassis, MD;  Location: [facility];  Service: Neurosurgery;  Laterality: N/Santos;  Cervical Five-Six Anterior Cervical decompression/diskectomy fusion    OB History   Grav Para Term Preterm Abortions TAB SAB Ect Mult Living   2 1 1       1      # Outc Date GA Lbr Len/2nd Wgt Sex Del Anes PTL Lv   1 TRM 2008    F SVD EPI No Yes   2 CUR               History   Social History  . Marital Status: Married    Spouse Name: N/Santos    Number of Children: N/Santos  . Years of Education: N/Santos   Occupational History  . Not on file.   Social History Main Topics  . Smoking status: Never Smoker   . Smokeless tobacco: Never Used  . Alcohol Use: No     Comment: occassionally  . Drug Use: No  . Sexually Active: Yes    Birth Control/ Protection: None   Other Topics Concern  . Not on file   Social History Narrative  . No narrative on file   Review of patient's allergies indicates no known allergies.    Prenatal Transfer Tool  Maternal Diabetes: No Genetic Screening: declined Maternal Ultrasounds/Referrals: Normal Fetal Ultrasounds or other Referrals:  None Maternal Substance Abuse:  No Significant Maternal Medications:  None Significant Maternal Lab Results: None  Other PNC:Hx HSV, no recent outbreaks, on Valtrex    Filed Vitals:   10/20/12 1139  BP: 143/105  Pulse: 88  Resp: 18      Lungs/Cor:  NAD Abdomen:  soft, gravid Ex:  no cords, erythema SVE:  3/60/-1 SSE neg for lesions FHTs:  140s, good STV, NST R Toco:  qocc   Santos/P   Preeclampsia at term,  For induction  GBS neg.  PIH labs.  Suzanne Santos

## 2012-10-20 NOTE — Progress Notes (Signed)
Filed Vitals:   10/20/12 1749  BP: 136/68  Pulse: 53  Temp:   Resp: 16   Results for orders placed during the hospital encounter of 10/20/12 (from the past 24 hour(s))  OB RESULTS CONSOLE GBS     Status: None   Collection Time    10/20/12 12:00 AM      Result Value Range   GBS Negative    OB RESULTS CONSOLE GC/CHLAMYDIA     Status: None   Collection Time    10/20/12 12:00 AM      Result Value Range   Gonorrhea Negative     Chlamydia Negative    OB RESULTS CONSOLE RPR     Status: None   Collection Time    10/20/12 12:00 AM      Result Value Range   RPR Nonreactive    OB RESULTS CONSOLE HIV ANTIBODY (ROUTINE TESTING)     Status: None   Collection Time    10/20/12 12:00 AM      Result Value Range   HIV Non-reactive    OB RESULTS CONSOLE RUBELLA ANTIBODY, IGM     Status: None   Collection Time    10/20/12 12:00 AM      Result Value Range   Rubella Immune    OB RESULTS CONSOLE HEPATITIS B SURFACE ANTIGEN     Status: None   Collection Time    10/20/12 12:00 AM      Result Value Range   Hepatitis B Surface Ag Negative    OB RESULTS CONSOLE ABO/RH     Status: None   Collection Time    10/20/12 12:00 AM      Result Value Range   RH Type  Positive     ABO Grouping B    OB RESULTS CONSOLE ANTIBODY SCREEN     Status: None   Collection Time    10/20/12 12:00 AM      Result Value Range   Antibody Screen Negative    CBC     Status: Abnormal   Collection Time    10/20/12 11:30 AM      Result Value Range   WBC 10.6 (*) 4.0 - 10.5 K/uL   RBC 3.68 (*) 3.87 - 5.11 MIL/uL   Hemoglobin 10.2 (*) 12.0 - 15.0 g/dL   HCT 21.3 (*) 08.6 - 57.8 %   MCV 87.5  78.0 - 100.0 fL   MCH 27.7  26.0 - 34.0 pg   MCHC 31.7  30.0 - 36.0 g/dL   RDW 46.9  62.9 - 52.8 %   Platelets 181  150 - 400 K/uL  COMPREHENSIVE METABOLIC PANEL     Status: Abnormal   Collection Time    10/20/12 11:30 AM      Result Value Range   Sodium 136  135 - 145 mEq/L   Potassium 4.0  3.5 - 5.1 mEq/L   Chloride 102   96 - 112 mEq/L   CO2 21  19 - 32 mEq/L   Glucose, Bld 84  70 - 99 mg/dL   BUN 11  6 - 23 mg/dL   Creatinine, Ser 4.13  0.50 - 1.10 mg/dL   Calcium 9.1  8.4 - 24.4 mg/dL   Total Protein 6.3  6.0 - 8.3 g/dL   Albumin 2.3 (*) 3.5 - 5.2 g/dL   AST 18  0 - 37 U/L   ALT 12  0 - 35 U/L   Alkaline Phosphatase 257 (*) 39 - 117 U/L  Total Bilirubin 0.2 (*) 0.3 - 1.2 mg/dL   GFR calc non Af Amer >90  >90 mL/min   GFR calc Af Amer >90  >90 mL/min

## 2012-10-21 LAB — CBC
HCT: 26.4 % — ABNORMAL LOW (ref 36.0–46.0)
Hemoglobin: 8.3 g/dL — ABNORMAL LOW (ref 12.0–15.0)
RBC: 3 MIL/uL — ABNORMAL LOW (ref 3.87–5.11)
WBC: 13.9 10*3/uL — ABNORMAL HIGH (ref 4.0–10.5)

## 2012-10-21 NOTE — Anesthesia Postprocedure Evaluation (Signed)
  Anesthesia Post-op Note  Patient: Suzanne Santos  Procedure(s) Performed: * No procedures listed *  Patient Location: PACU and Mother/Baby  Anesthesia Type:Epidural  Level of Consciousness: awake, alert  and oriented  Airway and Oxygen Therapy: Patient Spontanous Breathing  Post-op Pain: mild  Post-op Assessment: Post-op Vital signs reviewed, Patient's Cardiovascular Status Stable, No headache, No backache, No residual numbness and No residual motor weakness  Post-op Vital Signs: Reviewed and stable  Complications: No apparent anesthesia complications

## 2012-10-21 NOTE — Progress Notes (Signed)

## 2012-10-21 NOTE — Progress Notes (Addendum)
Patient is eating, ambulating, voiding.  Pain control is good.  Appropriate lochia.  No complaints.  Filed Vitals:   10/20/12 2045 10/20/12 2135 10/21/12 0145 10/21/12 0900  BP: 153/91 162/91 141/83 128/84  Pulse: 84 83 50 80  Temp: 97.6 F (36.4 C) 97.9 F (36.6 C) 97.8 F (36.6 C) 97.9 F (36.6 C)  TempSrc: Oral Oral Oral Oral  Resp: 16 16 16 18   Height:      Weight:      SpO2: 100% 100% 97% 98%    Fundus firm Perineum without swelling. No CT  Lab Results  Component Value Date   WBC 13.9* 10/21/2012   HGB 8.3* 10/21/2012   HCT 26.4* 10/21/2012   MCV 88.0 10/21/2012   PLT 141* 10/21/2012    B/Positive/-- (02/11 0000)  A/P Post partum day 1, mild PEC. BPs mainly mild range with occassional severe range.  Labs show drop from 181 to 141 for platelets.  Pt asymptomatic.  Monitor.  Routine care.  Expect d/c 2/13.   CIRC desired. Consent obtained.  Philip Aspen

## 2012-10-22 MED ORDER — OXYCODONE-ACETAMINOPHEN 5-325 MG PO TABS: 1.0000 | ORAL_TABLET | ORAL | Status: DC | PRN

## 2012-10-22 NOTE — Discharge Summary (Signed)
Obstetric Discharge Summary Reason for Admission: induction of labor Prenatal Procedures: Preeclampsia and ultrasound Intrapartum Procedures: spontaneous vaginal delivery Postpartum Procedures: none Complications-Operative and Postpartum: 2 degree perineal laceration Hemoglobin  Date Value Range Status  10/21/2012 8.3* 12.0 - 15.0 g/dL Final     HCT  Date Value Range Status  10/21/2012 26.4* 36.0 - 46.0 % Final    Physical Exam:  General: alert Lochia: appropriate Uterine Fundus: firm   Discharge Diagnoses: Term Pregnancy-delivered  Discharge Information: Date: 10/22/2012 Activity: pelvic rest Diet: routine Medications: PNV, Ibuprofen and Percocet Condition: stable Instructions: refer to practice specific booklet Discharge to: home Follow-up Information   Follow up with HORVATH,MICHELLE A, MD. Schedule an appointment as soon as possible for a visit in 1 week.   Contact information:   [address] [place] [address] [phone]       Newborn Data: Live born female  Birth Weight: 9 lb 1.5 oz (4125 g) APGAR: 9, 9  Home with mother.  ANDERSON,MARK E 10/22/2012, 12:59 PM

## 2012-10-22 NOTE — Progress Notes (Signed)
PPD#2 Pt without compliants. Pt wants to go home. Fundus- firm Lochia- mild IMP/ stable PLAN/ Will discharge. Pt will check B/P TID and call if elevated. Recheck in office next week.

## 2012-10-28 NOTE — Progress Notes (Signed)
Post discharge chart review completed.  

## 2013-01-08 ENCOUNTER — Other Ambulatory Visit (HOSPITAL_COMMUNITY): Payer: Self-pay | Admitting: Neurosurgery

## 2013-01-08 DIAGNOSIS — M545 Low back pain, unspecified: Secondary | ICD-10-CM

## 2013-01-08 DIAGNOSIS — M5410 Radiculopathy, site unspecified: Secondary | ICD-10-CM

## 2013-01-11 ENCOUNTER — Ambulatory Visit (HOSPITAL_COMMUNITY)
Admission: RE | Admit: 2013-01-11 | Discharge: 2013-01-11 | Disposition: A | Discharge status: Home / Self Care | Payer: 59 | Source: Ambulatory Visit | Attending: Neurosurgery | Admitting: Neurosurgery

## 2013-01-11 DIAGNOSIS — M5126 Other intervertebral disc displacement, lumbar region: Secondary | ICD-10-CM | POA: Insufficient documentation

## 2013-01-11 DIAGNOSIS — M545 Low back pain: Secondary | ICD-10-CM

## 2013-01-11 DIAGNOSIS — M5410 Radiculopathy, site unspecified: Secondary | ICD-10-CM

## 2013-05-01 ENCOUNTER — Emergency Department (INDEPENDENT_AMBULATORY_CARE_PROVIDER_SITE_OTHER)
Admission: EM | Admit: 2013-05-01 | Discharge: 2013-05-01 | Disposition: A | Discharge status: Home / Self Care | Payer: PRIVATE HEALTH INSURANCE | Source: Home / Self Care

## 2013-05-01 ENCOUNTER — Encounter (HOSPITAL_COMMUNITY): Payer: Self-pay | Admitting: Emergency Medicine

## 2013-05-01 DIAGNOSIS — M25511 Pain in right shoulder: Secondary | ICD-10-CM

## 2013-05-01 DIAGNOSIS — M25519 Pain in unspecified shoulder: Secondary | ICD-10-CM

## 2013-05-01 DIAGNOSIS — IMO0002 Reserved for concepts with insufficient information to code with codable children: Secondary | ICD-10-CM

## 2013-05-01 DIAGNOSIS — S46911A Strain of unspecified muscle, fascia and tendon at shoulder and upper arm level, right arm, initial encounter: Secondary | ICD-10-CM

## 2013-05-01 MED ORDER — IBUPROFEN 800 MG PO TABS: 800.0000 mg | ORAL_TABLET | Freq: Three times a day (TID) | ORAL | Status: DC

## 2013-05-01 MED ORDER — KETOROLAC TROMETHAMINE 60 MG/2ML IM SOLN
INTRAMUSCULAR | Status: AC
  Filled 2013-05-01: qty 2

## 2013-05-01 MED ORDER — TRAMADOL HCL 50 MG PO TABS: 50.0000 mg | ORAL_TABLET | Freq: Four times a day (QID) | ORAL | Status: DC | PRN

## 2013-05-01 MED ORDER — KETOROLAC TROMETHAMINE 60 MG/2ML IM SOLN
60.0000 mg | Freq: Once | INTRAMUSCULAR | Status: AC
  Administered 2013-05-01: 60 mg via INTRAMUSCULAR

## 2013-05-01 NOTE — ED Provider Notes (Signed)
Medical screening examination/treatment/procedure(s) were performed by a resident physician or non-physician practitioner and as the supervising physician I was immediately available for consultation/collaboration.  Lasasha Brophy, MD   Lindon Kiel S Omarri Eich, MD 05/01/13 2006 

## 2013-05-01 NOTE — ED Provider Notes (Signed)
CSN: [id number]     Arrival date & time 05/01/13  1123 History     None    Chief Complaint  Patient presents with  . Shoulder Pain   (Consider location/radiation/quality/duration/timing/severity/associated sxs/prior Treatment) HPI Comments: Mrs. Maxfield presents with right shoulder pain. 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 She states that the person on the other side helping with lifting, let go quickly and she felt a pop in her anterior shoulder. Having increased pain, and decreased ROM. No numbness or tingling. No cervical pain. Anterior shoulder pain.   Patient is a 32 y.o. female presenting with shoulder pain. The history is provided by the patient.  Shoulder Pain    Past Medical History  Diagnosis Date  . Depression     PREVENTITIVE  . Complication of anesthesia   . PONV (postoperative nausea and vomiting)   . 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 Cholecystectomy    . Anterior cervical decomp/discectomy fusion  11/08/2011    Procedure: ANTERIOR CERVICAL DECOMPRESSION/DISCECTOMY FUSION 1 LEVEL;  Surgeon: Karn Cassis, MD;  Location: [facility];  Service: Neurosurgery;  Laterality: N/A;  Cervical Five-Six Anterior Cervical decompression/diskectomy fusion   No family history on file. History  Substance Use Topics  . Smoking status: Never Smoker   . Smokeless tobacco: Never Used  . Alcohol Use: No     Comment: occassionally   OB History   Grav Para Term Preterm Abortions TAB SAB Ect Mult Living   2 2 2       2      Review of Systems  All other systems reviewed and are negative.    Allergies  Review of patient's allergies indicates no known allergies.  Home Medications   Current Outpatient Rx  Name  Route  Sig  Dispense  Refill  . citalopram (CELEXA) 20 MG tablet   Oral   Take 20 mg by mouth daily.          . diazepam (VALIUM) 5 MG tablet   Oral   Take 5 mg by mouth every 6 (six) hours as needed for anxiety.         Marland Kitchen HYDROcodone-acetaminophen (NORCO/VICODIN) 5-325 MG per tablet   Oral   Take 1 tablet by mouth every 6 (six) hours as needed for pain.         . Multiple Vitamins-Minerals (MULTIVITAMIN PO)   Oral   Take by mouth.         . cetirizine (ZYRTEC) 10 MG tablet   Oral   Take 10 mg by mouth daily.         . ferrous sulfate 325 (65 FE) MG tablet   Oral   Take 325 mg by mouth daily with breakfast.         . FLUoxetine (PROZAC) 40 MG capsule   Oral   Take 40 mg by mouth daily.         Marland Kitchen ibuprofen (ADVIL,MOTRIN) 800 MG tablet   Oral   Take 1 tablet (800 mg total) by mouth 3 (three) times daily.   21 tablet   0   . oxyCODONE-acetaminophen (PERCOCET/ROXICET) 5-325 MG per tablet   Oral   Take 1-2 tablets by mouth every 4 (four) hours as needed.   30 tablet   0   . prenatal vitamin  w/FE, FA (NATACHEW) 29-1 MG CHEW   Oral   Chew 2 tablets by mouth daily.         . traMADol (ULTRAM) 50 MG tablet   Oral   Take 1 tablet (50 mg total) by mouth every 6 (six) hours as needed for pain.   15 tablet   0    BP 155/100  Pulse 75  Temp(Src) 98.2 F (36.8 C) (Oral)  Resp 16  SpO2 100%  LMP 03/28/2013 Physical Exam  Nursing note and vitals reviewed. Constitutional: She is oriented to person, place, and time. She appears well-developed and well-nourished.  HENT:  Head: Normocephalic and atraumatic.  Neck: Normal range of motion.  Musculoskeletal: She exhibits tenderness. She exhibits no edema.       Right shoulder: She exhibits decreased range of motion, tenderness, swelling, pain and decreased strength. She exhibits no bony tenderness, no effusion, no crepitus, no deformity, no laceration, no spasm and normal pulse.  Neurological: She is alert and oriented to person, place, and time.  Skin: Skin is warm and dry.  Psychiatric: Her behavior is normal.     ED Course   Procedures (including critical care time)  Labs Reviewed - No data to display No results found. 1. Shoulder strain, right, initial encounter   2. Shoulder pain, acute, right     MDM  WC-Right shoulder strain-Today-Painful-Treat with NSAIDs and pain medication-F/U with [facility], may need Ortho referral  Azucena Fallen, PA-C 05/01/13 1229

## 2013-05-01 NOTE — ED Notes (Signed)
Right shoulder pain.  Reports assisting in picking up child, other person assisting with lift, dropped prior to patient.  Patient reports weight of lift went on her, her arm, and shoulder .

## 2013-05-01 NOTE — ED Notes (Signed)
Patient has ice pack intact

## 2013-05-06 ENCOUNTER — Other Ambulatory Visit: Payer: Self-pay | Admitting: Occupational Medicine

## 2013-05-06 ENCOUNTER — Ambulatory Visit: Payer: Worker's Compensation

## 2013-05-06 ENCOUNTER — Other Ambulatory Visit (HOSPITAL_COMMUNITY): Payer: Self-pay | Admitting: Family Medicine

## 2013-05-06 DIAGNOSIS — M25511 Pain in right shoulder: Secondary | ICD-10-CM

## 2013-05-06 DIAGNOSIS — R52 Pain, unspecified: Secondary | ICD-10-CM

## 2013-05-08 ENCOUNTER — Encounter (HOSPITAL_COMMUNITY): Payer: Self-pay | Admitting: *Deleted

## 2013-05-08 ENCOUNTER — Emergency Department (HOSPITAL_COMMUNITY)
Admission: EM | Admit: 2013-05-08 | Discharge: 2013-05-08 | Disposition: A | Discharge status: Home / Self Care | Payer: PRIVATE HEALTH INSURANCE | Attending: Emergency Medicine | Admitting: Emergency Medicine

## 2013-05-08 DIAGNOSIS — S46909A Unspecified injury of unspecified muscle, fascia and tendon at shoulder and upper arm level, unspecified arm, initial encounter: Secondary | ICD-10-CM | POA: Insufficient documentation

## 2013-05-08 DIAGNOSIS — F329 Major depressive disorder, single episode, unspecified: Secondary | ICD-10-CM | POA: Insufficient documentation

## 2013-05-08 DIAGNOSIS — Z79899 Other long term (current) drug therapy: Secondary | ICD-10-CM | POA: Insufficient documentation

## 2013-05-08 DIAGNOSIS — R000 Tachycardia, unspecified: Secondary | ICD-10-CM | POA: Insufficient documentation

## 2013-05-08 DIAGNOSIS — Y990 Civilian activity done for income or pay: Secondary | ICD-10-CM | POA: Insufficient documentation

## 2013-05-08 DIAGNOSIS — F3289 Other specified depressive episodes: Secondary | ICD-10-CM | POA: Insufficient documentation

## 2013-05-08 DIAGNOSIS — Z8619 Personal history of other infectious and parasitic diseases: Secondary | ICD-10-CM | POA: Insufficient documentation

## 2013-05-08 DIAGNOSIS — Y9389 Activity, other specified: Secondary | ICD-10-CM | POA: Insufficient documentation

## 2013-05-08 DIAGNOSIS — Y929 Unspecified place or not applicable: Secondary | ICD-10-CM | POA: Insufficient documentation

## 2013-05-08 DIAGNOSIS — S4980XA Other specified injuries of shoulder and upper arm, unspecified arm, initial encounter: Secondary | ICD-10-CM | POA: Insufficient documentation

## 2013-05-08 DIAGNOSIS — Z8742 Personal history of other diseases of the female genital tract: Secondary | ICD-10-CM | POA: Insufficient documentation

## 2013-05-08 DIAGNOSIS — X500XXA Overexertion from strenuous movement or load, initial encounter: Secondary | ICD-10-CM | POA: Insufficient documentation

## 2013-05-08 DIAGNOSIS — M25511 Pain in right shoulder: Secondary | ICD-10-CM

## 2013-05-08 MED ORDER — OXYCODONE-ACETAMINOPHEN 5-325 MG PO TABS: 2.0000 | ORAL_TABLET | ORAL | Status: DC | PRN

## 2013-05-08 NOTE — ED Provider Notes (Signed)
CSN: [id number]     Arrival date & time 05/08/13  1611 History   First MD Initiated Contact with Patient 05/08/13 1641     Chief Complaint  Patient presents with  . Shoulder Pain   (Consider location/radiation/quality/duration/timing/severity/associated sxs/prior Treatment) HPI Suzanne Santos is a 32 y.o. female who presents to the ED with right shoulder pain. 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 Someone dropped their side of the board the patient was on and she grabbed it and felt a sharp pain in her right shoulder. She has been evaluated by [facility] and at the Urgent Care. She is currently taking ibuprofen and tramadol for pain without any relief. She has not been able to sleep due to the pain. She has an appointment for an MRI and follow up but today the pain became severe.   Past Medical History  Diagnosis Date  . Depression     PREVENTITIVE  . Complication of anesthesia   . PONV (postoperative nausea and vomiting)   . 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 Cholecystectomy    . Anterior cervical decomp/discectomy fusion  11/08/2011    Procedure: ANTERIOR CERVICAL DECOMPRESSION/DISCECTOMY FUSION 1 LEVEL;  Surgeon: Karn Cassis, MD;  Location: [facility];  Service: Neurosurgery;  Laterality: N/A;  Cervical Five-Six Anterior Cervical decompression/diskectomy fusion   History reviewed. No pertinent family history. History  Substance Use Topics  . Smoking status: Never Smoker   . Smokeless tobacco: Never Used  . Alcohol Use: No     Comment: occassionally   OB History   Grav Para Term Preterm Abortions TAB SAB Ect Mult Living   2 2 2       2      Review of Systems  Constitutional: Negative for fever and chills.  Gastrointestinal: Negative for nausea and  vomiting.  Musculoskeletal:       Right shoulder pain  Skin: Negative for wound.  Neurological: Negative for headaches.  Psychiatric/Behavioral: The patient is not nervous/anxious.     Allergies  Review of patient's allergies indicates no known allergies.  Home Medications   Current Outpatient Rx  Name  Route  Sig  Dispense  Refill  . citalopram (CELEXA) 20 MG tablet   Oral   Take 20 mg by mouth daily.         . diazepam (VALIUM) 5 MG tablet   Oral   Take 5 mg by mouth every 6 (six) hours as needed for anxiety.         Marland Kitchen ibuprofen (ADVIL,MOTRIN) 800 MG tablet   Oral   Take 1 tablet (800 mg total) by mouth 3 (three) times daily.   21 tablet   0   . Multiple Vitamins-Minerals (MULTIVITAMIN PO)   Oral   Take by mouth.         . prenatal vitamin w/FE, FA (NATACHEW) 29-1 MG CHEW   Oral   Chew 2 tablets by mouth daily.         . traMADol (ULTRAM) 50 MG tablet   Oral   Take 1 tablet (50 mg total) by mouth every 6 (six) hours as needed for pain.   15 tablet   0   . oxyCODONE-acetaminophen (PERCOCET/ROXICET) 5-325 MG per tablet  Oral   Take 2 tablets by mouth every 4 (four) hours as needed for pain.   15 tablet   0    BP 149/85  Pulse 106  Temp(Src) 98.7 F (37.1 C) (Oral)  Resp 20  Ht 5\' 7"  (1.702 m)  Wt 189 lb (85.73 kg)  BMI 29.59 kg/m2  SpO2 100%  LMP 03/28/2013  Breastfeeding? Yes Physical Exam  Nursing note and vitals reviewed. Constitutional: She is oriented to person, place, and time. She appears well-developed and well-nourished. No distress.  HENT:  Head: Normocephalic and atraumatic.  Eyes: EOM are normal.  Neck: Neck supple.  Cardiovascular: Tachycardia present.   Pulmonary/Chest: Effort normal.  Abdominal: Soft. There is no tenderness.  Musculoskeletal:       Right shoulder: She exhibits decreased range of motion and tenderness. She exhibits no deformity, no laceration, no spasm, normal pulse and normal strength.  Right shoulder  pain with palpation and passive range of motion. Radial pulse presents and strong, adequate circulation, good touch sensation.   Neurological: She is alert and oriented to person, place, and time. No cranial nerve deficit.  Skin: Skin is warm and dry.  Psychiatric: She has a normal mood and affect. Her behavior is normal.    ED Course  Procedures   MDM  32 y.o. female [name] Employee with right shoulder pain due to injury at work. Here today due to pain. Will treat pain and she will keep her follow up for MRI and MD visit.  Discussed with the patient and all questioned fully answered.    Medication List    TAKE these medications       oxyCODONE-acetaminophen 5-325 MG per tablet  Commonly known as:  PERCOCET/ROXICET  Take 2 tablets by mouth every 4 (four) hours as needed for pain.      ASK your doctor about these medications       citalopram 20 MG tablet  Commonly known as:  CELEXA  Take 20 mg by mouth daily.     diazepam 5 MG tablet  Commonly known as:  VALIUM  Take 5 mg by mouth every 6 (six) hours as needed for anxiety.     ibuprofen 800 MG tablet  Commonly known as:  ADVIL,MOTRIN  Take 1 tablet (800 mg total) by mouth 3 (three) times daily.     MULTIVITAMIN PO  Take by mouth.     prenatal vitamin w/FE, FA 29-1 MG Chew chewable tablet  Chew 2 tablets by mouth daily.     traMADol 50 MG tablet  Commonly known as:  ULTRAM  Take 1 tablet (50 mg total) by mouth every 6 (six) hours as needed for pain.          [facility] Orlene Och, [place] 05/08/13 [phone]

## 2013-05-08 NOTE — ED Provider Notes (Signed)
Medical screening examination/treatment/procedure(s) were performed by non-physician practitioner and as supervising physician I was immediately available for consultation/collaboration.    Froylan Hobby D Alaric Gladwin, MD 05/08/13 1756 

## 2013-05-08 NOTE — ED Notes (Addendum)
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  Now has very limited ROM.  Has been to [facility] x 2.  Taking 800mg  Ibuprofen and Tramadol 100mg  between Ibuprofen doses.  Also using alt. Heat and ice.  Scheduled for MRI next week.

## 2013-05-11 ENCOUNTER — Ambulatory Visit (HOSPITAL_COMMUNITY)
Admission: RE | Admit: 2013-05-11 | Discharge: 2013-05-11 | Disposition: A | Discharge status: Home / Self Care | Payer: PRIVATE HEALTH INSURANCE | Source: Ambulatory Visit | Attending: Family Medicine | Admitting: Family Medicine

## 2013-05-11 DIAGNOSIS — R609 Edema, unspecified: Secondary | ICD-10-CM | POA: Insufficient documentation

## 2013-05-11 DIAGNOSIS — M75100 Unspecified rotator cuff tear or rupture of unspecified shoulder, not specified as traumatic: Secondary | ICD-10-CM | POA: Insufficient documentation

## 2013-05-11 DIAGNOSIS — R209 Unspecified disturbances of skin sensation: Secondary | ICD-10-CM | POA: Insufficient documentation

## 2013-05-11 DIAGNOSIS — M25519 Pain in unspecified shoulder: Secondary | ICD-10-CM | POA: Insufficient documentation

## 2013-05-11 DIAGNOSIS — S46819A Strain of other muscles, fascia and tendons at shoulder and upper arm level, unspecified arm, initial encounter: Secondary | ICD-10-CM | POA: Insufficient documentation

## 2013-05-11 DIAGNOSIS — X58XXXA Exposure to other specified factors, initial encounter: Secondary | ICD-10-CM | POA: Insufficient documentation

## 2013-05-11 DIAGNOSIS — M25511 Pain in right shoulder: Secondary | ICD-10-CM

## 2013-05-11 DIAGNOSIS — IMO0002 Reserved for concepts with insufficient information to code with codable children: Secondary | ICD-10-CM | POA: Insufficient documentation

## 2013-07-01 NOTE — Progress Notes (Signed)
Need orders please - pt coming for preop THURS 07/08/13 - thank you 

## 2013-07-02 ENCOUNTER — Other Ambulatory Visit: Payer: Self-pay | Admitting: Orthopedic Surgery

## 2013-07-05 ENCOUNTER — Encounter (HOSPITAL_COMMUNITY): Payer: Self-pay | Admitting: Pharmacy Technician

## 2013-07-05 ENCOUNTER — Other Ambulatory Visit: Payer: Self-pay | Admitting: Orthopedic Surgery

## 2013-07-05 NOTE — H&P (Signed)
Suzanne Santos is an 32 y.o. female.   Chief Complaint: right shoulder pain HPI: The patient is a 32 year old female being followed for their right shoulder pain. They are 9 weeks out from injury. Symptoms reported today include: pain and pain with overhead motions. and report their pain level to be 3-4 / 10. Current treatment includes: NSAIDs, pain medications and sling. The following medication has been used for pain control: antiinflammatory medication. The patient presents today following rest. The patient is currently working with light duty work restrictions of no use of the right arm. Refractory to steroid injection. Positive relief with subacromial injection of lidocaine.   Past Medical History  Diagnosis Date  . Depression     PREVENTITIVE  . Complication of anesthesia   . PONV (postoperative nausea and vomiting)   . 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 Cholecystectomy    . Anterior cervical decomp/discectomy fusion  11/08/2011    Procedure: ANTERIOR CERVICAL DECOMPRESSION/DISCECTOMY FUSION 1 LEVEL;  Surgeon: Suzanne Cassis, MD;  Location: [facility];  Service: Neurosurgery;  Laterality: N/A;  Cervical Five-Six Anterior Cervical decompression/diskectomy fusion    No family history on file. Social History:  reports that she has never smoked. She has never used smokeless tobacco. She reports that she does not drink alcohol or use illicit drugs.  Allergies: No Known Allergies   (Not in a hospital admission)  No results found for this or any previous visit (from the past 48 hour(s)). No results found.  Review of Systems  Constitutional: Negative.   HENT: Negative.   Eyes: Negative.   Respiratory: Negative.   Cardiovascular: Negative.   Gastrointestinal: Negative.   Genitourinary: Negative.   Musculoskeletal: Positive for joint pain.   Skin: Negative.   Neurological: Negative.   Endo/Heme/Allergies: Negative.   Psychiatric/Behavioral: Negative.     not currently breastfeeding. Physical Exam  Constitutional: She is oriented to person, place, and time. She appears well-developed and well-nourished.  HENT:  Head: Normocephalic and atraumatic.  Eyes: Conjunctivae and EOM are normal. Pupils are equal, round, and reactive to light.  Neck: Normal range of motion. Neck supple.  Cardiovascular: Normal rate and regular rhythm.   Respiratory: Effort normal and breath sounds normal.  GI: Soft. Bowel sounds are normal.  Musculoskeletal:  On exam, tender in the anterior subacromial region, weak in abduction. Positive impingement sign. Decreased internal rotation.  Inspection of the shoulder revealed no ecchymosis, soft tissue swelling or deformity. On palpation, nontender over the [facility]. Provocative signs indicated no impingement sign, no sulcus sign, negative Speed's test. Negative lift off. Sensory exam was intact and motor function was normal in the deltoid and the rotator cuff.  Inspection of the cervical spine reveals a normal lordosis without evidence of paraspinous spasms or soft tissue swelling. Nontender to palpation. Full flexion, full extension, full left and right lateral rotation. Extension combined with lateral flexion does not reproduce pain. Negative impingement sign, negative secondary impingement sign of the shoulders. Negative Tinel's at median and ulnar nerves at the elbow. Negative carpal compression test at the wrists. Motor of the upper extremities is 5/5 including biceps, triceps, brachioradialis,wrist flexion, wrist extension, finger flexion, finger extension. Reflexes are normoreflexic. Sensory exam is intact to light touch. There is no Hoffmann sign. Nontender over the thoracic spine.   Neurological: She is  alert and oriented to person, place, and time. She has normal reflexes.  Skin: Skin is warm and  dry.    MRI review demonstrates a small full thickness tear of the rotator cuff, fairly small with rotator cuff arthropathy.  Assessment/Plan Right shoulder RCT  Persistent impingement syndrome, small full thickness tear of the rotator cuff, persistent abduction pain, external rotation pain and weakness.  Given the persistence of her symptoms and the evidence of a small full thickness tear, we discussed proceeding with arthroscopic SAD and arthroscopically assisted mini open rotator cuff repair. We discussed continuing no use of the right arm in the interim, postoperatively light duty at perhaps 4 weeks, 3 to 4 months until MMI, postoperative P.T. at 2 weeks. 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 Prescriptions were given.  I had a long discussion with the patient concerning risks and benefits of shoulder arthroscopy including no changes, worsening in symptoms. Also the need for manipulation of the extremity, need for open rotator cuff repair. Also discussed infection, DVT, PE, anesthetic complications, etc. Also included was the possibility of requirement for a repeat debridement in the future. Perioperative course was discussed in detail as well as time to recovery. An illustrated handout was provided and discussed in detail.  Plan right shoulder arthroscopy, SAD, mini-open RCR  Suzanne Santos M. for Dr. Shelle Iron 07/05/2013, 8:52 AM

## 2013-07-07 ENCOUNTER — Other Ambulatory Visit (HOSPITAL_COMMUNITY): Payer: Self-pay | Admitting: Specialist

## 2013-07-07 NOTE — Patient Instructions (Addendum)
20 Suzanne Santos  07/07/2013   Your procedure is scheduled on:  07/15/13  THURSDAY  Report to [facility] at  0800     AM.  Call this number if you have problems the morning of surgery: [phone]       Remember:   Do not eat food  Or drink :After Midnight. Wednesday NIGHT   Take these medicines the morning of surgery with A SIP OF WATER: Citalopram, Cymbalta                                            May take  Hydrocodone if needed  DO NOT APPLY DEDORANT MORNING OF SURGERY .  Contacts, dentures or partial plates can not be worn to surgery  Leave suitcase in the car. After surgery it may be brought to your room.  For patients admitted to the hospital, checkout time is 11:00 AM day of  discharge.             SPECIAL INSTRUCTIONS- SEE [place] PREPARING FOR SURGERY INSTRUCTION SHEET-     DO NOT WEAR JEWELRY, LOTIONS, POWDERS, OR PERFUMES.  WOMEN-- DO NOT SHAVE LEGS OR UNDERARMS FOR 12 HOURS BEFORE SHOWERS. MEN MAY SHAVE FACE.  Patients discharged the day of surgery will not be allowed to drive home. IF going home the day of surgery, you must have a driver and someone to stay with you for the first 24 hours  Name and phone number of your driver:        husband                                                                Please read over the following fact sheets that you were given:Incentive Spirometry Sheet                                                                                  Tom Macpherson  PST [phone]  [phone]                 FAILURE TO FOLLOW THESE INSTRUCTIONS MAY RESULT IN  CANCELLATION   OF YOUR SURGERY                                                  Patient Signature _____________________________

## 2013-07-08 ENCOUNTER — Encounter (HOSPITAL_COMMUNITY): Payer: Self-pay

## 2013-07-08 ENCOUNTER — Encounter (HOSPITAL_COMMUNITY)
Admission: RE | Admit: 2013-07-08 | Discharge: 2013-07-08 | Disposition: A | Discharge status: Home / Self Care | Payer: PRIVATE HEALTH INSURANCE | Source: Ambulatory Visit | Attending: Specialist | Admitting: Specialist

## 2013-07-08 DIAGNOSIS — Z01818 Encounter for other preprocedural examination: Secondary | ICD-10-CM | POA: Insufficient documentation

## 2013-07-08 DIAGNOSIS — Z01812 Encounter for preprocedural laboratory examination: Secondary | ICD-10-CM | POA: Insufficient documentation

## 2013-07-08 LAB — HCG, SERUM, QUALITATIVE: Preg, Serum: NEGATIVE

## 2013-07-08 LAB — CBC
HCT: 38.5 % (ref 36.0–46.0)
Hemoglobin: 12.5 g/dL (ref 12.0–15.0)
RDW: 13.2 % (ref 11.5–15.5)
WBC: 9.1 10*3/uL (ref 4.0–10.5)

## 2013-07-15 ENCOUNTER — Ambulatory Visit (HOSPITAL_COMMUNITY)
Admission: RE | Admit: 2013-07-15 | Discharge: 2013-07-15 | Disposition: A | Discharge status: Home / Self Care | Payer: PRIVATE HEALTH INSURANCE | Source: Ambulatory Visit | Attending: Specialist | Admitting: Specialist

## 2013-07-15 ENCOUNTER — Encounter (HOSPITAL_COMMUNITY): Payer: Self-pay | Admitting: *Deleted

## 2013-07-15 ENCOUNTER — Encounter (HOSPITAL_COMMUNITY)
Admission: RE | Disposition: A | Discharge status: Home / Self Care | Payer: Self-pay | Source: Ambulatory Visit | Attending: Specialist

## 2013-07-15 ENCOUNTER — Encounter (HOSPITAL_COMMUNITY): Payer: PRIVATE HEALTH INSURANCE | Admitting: Certified Registered Nurse Anesthetist

## 2013-07-15 ENCOUNTER — Ambulatory Visit (HOSPITAL_COMMUNITY): Payer: PRIVATE HEALTH INSURANCE | Admitting: Certified Registered Nurse Anesthetist

## 2013-07-15 DIAGNOSIS — M25819 Other specified joint disorders, unspecified shoulder: Secondary | ICD-10-CM | POA: Insufficient documentation

## 2013-07-15 DIAGNOSIS — S43499A Other sprain of unspecified shoulder joint, initial encounter: Secondary | ICD-10-CM | POA: Insufficient documentation

## 2013-07-15 DIAGNOSIS — Z9089 Acquired absence of other organs: Secondary | ICD-10-CM | POA: Insufficient documentation

## 2013-07-15 DIAGNOSIS — M7541 Impingement syndrome of right shoulder: Secondary | ICD-10-CM

## 2013-07-15 DIAGNOSIS — X58XXXA Exposure to other specified factors, initial encounter: Secondary | ICD-10-CM | POA: Insufficient documentation

## 2013-07-15 HISTORY — PX: SHOULDER ARTHROSCOPY WITH OPEN ROTATOR CUFF REPAIR: SHX6092

## 2013-07-15 SURGERY — ARTHROSCOPY, SHOULDER WITH REPAIR, ROTATOR CUFF, OPEN
Anesthesia: General | Site: Shoulder | Laterality: Right | Wound class: Clean

## 2013-07-15 MED ORDER — FENTANYL CITRATE 0.05 MG/ML IJ SOLN
INTRAMUSCULAR | Status: DC | PRN
  Administered 2013-07-15 (×2): 50 ug via INTRAVENOUS
  Administered 2013-07-15: 100 ug via INTRAVENOUS
  Administered 2013-07-15: 50 ug via INTRAVENOUS

## 2013-07-15 MED ORDER — LACTATED RINGERS IV SOLN: INTRAVENOUS | Status: DC

## 2013-07-15 MED ORDER — KETOROLAC TROMETHAMINE 10 MG PO TABS: 10.0000 mg | ORAL_TABLET | Freq: Four times a day (QID) | ORAL | Status: DC | PRN

## 2013-07-15 MED ORDER — BUPIVACAINE-EPINEPHRINE PF 0.5-1:200000 % IJ SOLN
INTRAMUSCULAR | Status: AC
  Filled 2013-07-15: qty 30

## 2013-07-15 MED ORDER — PROPOFOL 10 MG/ML IV BOLUS
INTRAVENOUS | Status: DC | PRN
  Administered 2013-07-15: 170 mg via INTRAVENOUS

## 2013-07-15 MED ORDER — EPINEPHRINE HCL 1 MG/ML IJ SOLN
INTRAMUSCULAR | Status: AC
  Filled 2013-07-15: qty 2

## 2013-07-15 MED ORDER — SCOPOLAMINE 1 MG/3DAYS TD PT72
MEDICATED_PATCH | TRANSDERMAL | Status: AC
  Filled 2013-07-15: qty 1

## 2013-07-15 MED ORDER — LACTATED RINGERS IV SOLN
INTRAVENOUS | Status: DC
  Administered 2013-07-15: 12:00:00 via INTRAVENOUS
  Administered 2013-07-15: 1000 mL via INTRAVENOUS

## 2013-07-15 MED ORDER — NEOSTIGMINE METHYLSULFATE 1 MG/ML IJ SOLN
INTRAMUSCULAR | Status: DC | PRN
  Administered 2013-07-15: 5 mg via INTRAVENOUS

## 2013-07-15 MED ORDER — OXYCODONE-ACETAMINOPHEN 5-325 MG PO TABS: 1.0000 | ORAL_TABLET | ORAL | Status: DC | PRN

## 2013-07-15 MED ORDER — EPINEPHRINE HCL 1 MG/ML IJ SOLN
INTRAMUSCULAR | Status: DC | PRN
  Administered 2013-07-15 (×2): 1 mg

## 2013-07-15 MED ORDER — METOCLOPRAMIDE HCL 5 MG/ML IJ SOLN
INTRAMUSCULAR | Status: DC | PRN
  Administered 2013-07-15: 10 mg via INTRAVENOUS

## 2013-07-15 MED ORDER — KETOROLAC TROMETHAMINE 30 MG/ML IJ SOLN
INTRAMUSCULAR | Status: DC | PRN
  Administered 2013-07-15: 30 mg via INTRAVENOUS

## 2013-07-15 MED ORDER — CEFAZOLIN SODIUM-DEXTROSE 2-3 GM-% IV SOLR
2.0000 g | INTRAVENOUS | Status: AC
  Administered 2013-07-15: 2 g via INTRAVENOUS

## 2013-07-15 MED ORDER — BUPIVACAINE-EPINEPHRINE 0.5% -1:200000 IJ SOLN
INTRAMUSCULAR | Status: DC | PRN
  Administered 2013-07-15: 30 mL

## 2013-07-15 MED ORDER — MIDAZOLAM HCL 5 MG/5ML IJ SOLN
INTRAMUSCULAR | Status: DC | PRN
  Administered 2013-07-15: 2 mg via INTRAVENOUS

## 2013-07-15 MED ORDER — DEXAMETHASONE SODIUM PHOSPHATE 10 MG/ML IJ SOLN
INTRAMUSCULAR | Status: DC | PRN
  Administered 2013-07-15: 10 mg via INTRAVENOUS

## 2013-07-15 MED ORDER — CLINDAMYCIN PHOSPHATE 900 MG/50ML IV SOLN
INTRAVENOUS | Status: AC
  Filled 2013-07-15: qty 50

## 2013-07-15 MED ORDER — LIDOCAINE HCL (CARDIAC) 20 MG/ML IV SOLN
INTRAVENOUS | Status: DC | PRN
  Administered 2013-07-15: 80 mg via INTRAVENOUS

## 2013-07-15 MED ORDER — CLINDAMYCIN PHOSPHATE 900 MG/50ML IV SOLN
900.0000 mg | INTRAVENOUS | Status: AC
  Administered 2013-07-15: 900 mg via INTRAVENOUS
  Filled 2013-07-15: qty 50

## 2013-07-15 MED ORDER — OXYCODONE-ACETAMINOPHEN 5-325 MG PO TABS
1.0000 | ORAL_TABLET | Freq: Once | ORAL | Status: AC
  Administered 2013-07-15: 1 via ORAL
  Filled 2013-07-15: qty 1

## 2013-07-15 MED ORDER — PHENYLEPHRINE HCL 10 MG/ML IJ SOLN
INTRAMUSCULAR | Status: DC | PRN
  Administered 2013-07-15: 80 ug via INTRAVENOUS
  Administered 2013-07-15: 40 ug via INTRAVENOUS

## 2013-07-15 MED ORDER — ROCURONIUM BROMIDE 100 MG/10ML IV SOLN
INTRAVENOUS | Status: DC | PRN
  Administered 2013-07-15: 40 mg via INTRAVENOUS
  Administered 2013-07-15: 5 mg via INTRAVENOUS

## 2013-07-15 MED ORDER — ONDANSETRON HCL 4 MG/2ML IJ SOLN
INTRAMUSCULAR | Status: DC | PRN
  Administered 2013-07-15: 4 mg via INTRAVENOUS

## 2013-07-15 MED ORDER — SCOPOLAMINE 1 MG/3DAYS TD PT72
MEDICATED_PATCH | TRANSDERMAL | Status: DC | PRN
  Administered 2013-07-15: 1 via TRANSDERMAL

## 2013-07-15 MED ORDER — CEFAZOLIN SODIUM-DEXTROSE 2-3 GM-% IV SOLR
INTRAVENOUS | Status: AC
  Filled 2013-07-15: qty 50

## 2013-07-15 MED ORDER — LACTATED RINGERS IR SOLN
Status: DC | PRN
  Administered 2013-07-15 (×2): 3000 mL

## 2013-07-15 MED ORDER — GLYCOPYRROLATE 0.2 MG/ML IJ SOLN
INTRAMUSCULAR | Status: DC | PRN
  Administered 2013-07-15: 0.6 mg via INTRAVENOUS

## 2013-07-15 MED ORDER — HYDROMORPHONE HCL PF 1 MG/ML IJ SOLN: 0.2500 mg | INTRAMUSCULAR | Status: DC | PRN

## 2013-07-15 SURGICAL SUPPLY — 50 items
ANCHOR NDL 9/16 CIR SZ 8 (NEEDLE) IMPLANT
ANCHOR NEEDLE 9/16 CIR SZ 8 (NEEDLE) IMPLANT
BLADE CUTTER GATOR 3.5 (BLADE) ×2 IMPLANT
BLADE SURG SZ11 CARB STEEL (BLADE) ×2 IMPLANT
BUR OVAL 4.0 (BURR) IMPLANT
CANNULA ACUFO 5X76 (CANNULA) ×2 IMPLANT
CHLORAPREP W/TINT 26ML (MISCELLANEOUS) IMPLANT
CLEANER TIP ELECTROSURG 2X2 (MISCELLANEOUS) IMPLANT
CLOTH 2% CHLOROHEXIDINE 3PK (PERSONAL CARE ITEMS) ×2 IMPLANT
CLOTH BEACON ORANGE TIMEOUT ST (SAFETY) ×2 IMPLANT
DECANTER SPIKE VIAL GLASS SM (MISCELLANEOUS) ×2 IMPLANT
DRAPE ORTHO SPLIT 77X108 STRL (DRAPES)
DRAPE POUCH INSTRU U-SHP 10X18 (DRAPES) IMPLANT
DRAPE SURG ORHT 6 SPLT 77X108 (DRAPES) IMPLANT
DRAPE U-SHAPE 47X51 STRL (DRAPES) ×2 IMPLANT
DRSG AQUACEL AG ADV 3.5X 4 (GAUZE/BANDAGES/DRESSINGS) ×2 IMPLANT
DRSG AQUACEL AG ADV 3.5X 6 (GAUZE/BANDAGES/DRESSINGS) ×1 IMPLANT
DRSG EMULSION OIL 3X3 NADH (GAUZE/BANDAGES/DRESSINGS) ×2 IMPLANT
DURAPREP 26ML APPLICATOR (WOUND CARE) ×2 IMPLANT
ELECT NEEDLE TIP 2.8 STRL (NEEDLE) ×2 IMPLANT
GLOVE BIOGEL PI IND STRL 7.5 (GLOVE) ×1 IMPLANT
GLOVE BIOGEL PI INDICATOR 7.5 (GLOVE) ×1
GLOVE SURG SS PI 7.5 STRL IVOR (GLOVE) ×2 IMPLANT
GLOVE SURG SS PI 8.0 STRL IVOR (GLOVE) ×4 IMPLANT
GOWN STRL REIN XL XLG (GOWN DISPOSABLE) ×4 IMPLANT
KIT BASIN OR (CUSTOM PROCEDURE TRAY) ×2 IMPLANT
KIT POSITION SHOULDER SCHLEI (MISCELLANEOUS) ×2 IMPLANT
MANIFOLD NEPTUNE II (INSTRUMENTS) ×2 IMPLANT
NDL MA TROC 1/2 (NEEDLE) IMPLANT
NDL MA TROC 1/2 CIR (NEEDLE) IMPLANT
NDL SCORPION MULTI FIRE (NEEDLE) ×1 IMPLANT
NEEDLE MA TROC 1/2 (NEEDLE) IMPLANT
NEEDLE MA TROC 1/2 CIR (NEEDLE) IMPLANT
NEEDLE SCORPION MULTI FIRE (NEEDLE) ×2 IMPLANT
NEEDLE SPNL 18GX3.5 QUINCKE PK (NEEDLE) ×2 IMPLANT
PACK SHOULDER CUSTOM OPM052 (CUSTOM PROCEDURE TRAY) ×2 IMPLANT
SLING ARM IMMOBILIZER LRG (SOFTGOODS) ×1 IMPLANT
SPONGE LAP 4X18 X RAY DECT (DISPOSABLE) IMPLANT
STRIP CLOSURE SKIN 1/2X4 (GAUZE/BANDAGES/DRESSINGS) ×1 IMPLANT
SUT BONE WAX W31G (SUTURE) IMPLANT
SUT ETHILON 4 0 PS 2 18 (SUTURE) ×2 IMPLANT
SUT PROLENE 3 0 PS 2 (SUTURE) IMPLANT
SUT TIGER TAPE 7 IN WHITE (SUTURE) IMPLANT
SUT VIC AB 1-0 CT2 27 (SUTURE) IMPLANT
SUT VIC AB 2-0 CT2 27 (SUTURE) IMPLANT
SUT VICRYL 0 UR6 27IN ABS (SUTURE) IMPLANT
SUT VICRYL 0-0 OS 2 NEEDLE (SUTURE) IMPLANT
TAPE FIBER 2MM 7IN #2 BLUE (SUTURE) IMPLANT
TUBING CONNECTING 10 (TUBING) ×2 IMPLANT
WAND 90 DEG TURBOVAC W/CORD (SURGICAL WAND) IMPLANT

## 2013-07-15 NOTE — Brief Op Note (Signed)
07/15/2013  12:21 PM  PATIENT:  Suzanne Santos  32 y.o. female  PRE-OPERATIVE DIAGNOSIS:  ROTATOR CUFF TEAR RIGHT SHOULDER   POST-OPERATIVE DIAGNOSIS:  ROTATOR CUFF TEAR RIGHT SHOULDER   PROCEDURE:  Procedure(s): RIGHT SHOULDER ARTHROSCOPY WITH SUBACROMIAL DECOMPRESSION, labral and cuff debridement (Right)  SURGEON:  Surgeon(s) and Role:    * Javier Docker, MD - Primary  PHYSICIAN ASSISTANT:   ASSISTANTS: Bissell   ANESTHESIA:   general  EBL:  Total I/O In: 1000 [I.V.:1000] Out: -   BLOOD ADMINISTERED:  DRAINS: none   LOCAL MEDICATIONS USED:  MARCAINE     SPECIMEN:  No Specimen  DISPOSITION OF SPECIMEN:  N/A  COUNTS:  YES  TOURNIQUET:  * No tourniquets in log *  DICTATION: .Other Dictation: Dictation Number [id number]  PLAN OF CARE: Discharge to home after PACU  PATIENT DISPOSITION:  PACU - hemodynamically stable.   Delay start of Pharmacological VTE agent (>24hrs) due to surgical blood loss or risk of bleeding: no

## 2013-07-15 NOTE — Anesthesia Postprocedure Evaluation (Signed)
  Anesthesia Post-op Note  Patient: Suzanne Santos  Procedure(s) Performed: Procedure(s) (LRB): RIGHT SHOULDER ARTHROSCOPY WITH SUBACROMIAL DECOMPRESSION, labral and cuff debridement (Right)  Patient Location: PACU  Anesthesia Type: General  Level of Consciousness: awake and alert   Airway and Oxygen Therapy: Patient Spontanous Breathing  Post-op Pain: mild  Post-op Assessment: Post-op Vital signs reviewed, Patient's Cardiovascular Status Stable, Respiratory Function Stable, Patent Airway and No signs of Nausea or vomiting  Last Vitals:  Filed Vitals:   07/15/13 1300  BP: 141/77  Pulse: 77  Temp: 37.3 C  Resp: 14    Post-op Vital Signs: stable   Complications: No apparent anesthesia complications

## 2013-07-15 NOTE — H&P (View-Only) (Signed)
Suzanne Santos is an 32 y.o. female.   Chief Complaint: right shoulder pain HPI: The patient is a 32 year old female being followed for their right shoulder pain. They are 9 weeks out from injury. Symptoms reported today include: pain and pain with overhead motions. and report their pain level to be 3-4 / 10. Current treatment includes: NSAIDs, pain medications and sling. The following medication has been used for pain control: antiinflammatory medication. The patient presents today following rest. The patient is currently working with light duty work restrictions of no use of the right arm. Refractory to steroid injection. Positive relief with subacromial injection of lidocaine.   Past Medical History  Diagnosis Date  . Depression     PREVENTITIVE  . Complication of anesthesia   . PONV (postoperative nausea and vomiting)   . 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 Cholecystectomy    . Anterior cervical decomp/discectomy fusion  11/08/2011    Procedure: ANTERIOR CERVICAL DECOMPRESSION/DISCECTOMY FUSION 1 LEVEL;  Surgeon: Ernesto M Botero, MD;  Location: [facility];  Service: Neurosurgery;  Laterality: N/A;  Cervical Five-Six Anterior Cervical decompression/diskectomy fusion    No family history on file. Social History:  reports that she has never smoked. She has never used smokeless tobacco. She reports that she does not drink alcohol or use illicit drugs.  Allergies: No Known Allergies   (Not in a hospital admission)  No results found for this or any previous visit (from the past 48 hour(s)). No results found.  Review of Systems  Constitutional: Negative.   HENT: Negative.   Eyes: Negative.   Respiratory: Negative.   Cardiovascular: Negative.   Gastrointestinal: Negative.   Genitourinary: Negative.   Musculoskeletal: Positive for joint pain.   Skin: Negative.   Neurological: Negative.   Endo/Heme/Allergies: Negative.   Psychiatric/Behavioral: Negative.     not currently breastfeeding. Physical Exam  Constitutional: She is oriented to person, place, and time. She appears well-developed and well-nourished.  HENT:  Head: Normocephalic and atraumatic.  Eyes: Conjunctivae and EOM are normal. Pupils are equal, round, and reactive to light.  Neck: Normal range of motion. Neck supple.  Cardiovascular: Normal rate and regular rhythm.   Respiratory: Effort normal and breath sounds normal.  GI: Soft. Bowel sounds are normal.  Musculoskeletal:  On exam, tender in the anterior subacromial region, weak in abduction. Positive impingement sign. Decreased internal rotation.  Inspection of the shoulder revealed no ecchymosis, soft tissue swelling or deformity. On palpation, nontender over the AC. Provocative signs indicated no impingement sign, no sulcus sign, negative Speed's test. Negative lift off. Sensory exam was intact and motor function was normal in the deltoid and the rotator cuff.  Inspection of the cervical spine reveals a normal lordosis without evidence of paraspinous spasms or soft tissue swelling. Nontender to palpation. Full flexion, full extension, full left and right lateral rotation. Extension combined with lateral flexion does not reproduce pain. Negative impingement sign, negative secondary impingement sign of the shoulders. Negative Tinel's at median and ulnar nerves at the elbow. Negative carpal compression test at the wrists. Motor of the upper extremities is 5/5 including biceps, triceps, brachioradialis,wrist flexion, wrist extension, finger flexion, finger extension. Reflexes are normoreflexic. Sensory exam is intact to light touch. There is no Hoffmann sign. Nontender over the thoracic spine.   Neurological: She is   alert and oriented to person, place, and time. She has normal reflexes.  Skin: Skin is warm and  dry.    MRI review demonstrates a small full thickness tear of the rotator cuff, fairly small with rotator cuff arthropathy.  Assessment/Plan Right shoulder RCT  Persistent impingement syndrome, small full thickness tear of the rotator cuff, persistent abduction pain, external rotation pain and weakness.  Given the persistence of her symptoms and the evidence of a small full thickness tear, we discussed proceeding with arthroscopic SAD and arthroscopically assisted mini open rotator cuff repair. We discussed continuing no use of the right arm in the interim, postoperatively light duty at perhaps 4 weeks, 3 to 4 months until MMI, postoperative P.T. at 2 weeks. 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 Prescriptions were given.  I had a long discussion with the patient concerning risks and benefits of shoulder arthroscopy including no changes, worsening in symptoms. Also the need for manipulation of the extremity, need for open rotator cuff repair. Also discussed infection, DVT, PE, anesthetic complications, etc. Also included was the possibility of requirement for a repeat debridement in the future. Perioperative course was discussed in detail as well as time to recovery. An illustrated handout was provided and discussed in detail.  Plan right shoulder arthroscopy, SAD, mini-open RCR  Tova Vater M. for Dr. Beane 07/05/2013, 8:52 AM    

## 2013-07-15 NOTE — Interval H&P Note (Signed)
History and Physical Interval Note:  07/15/2013 7:27 AM  Suzanne Santos  has presented today for surgery, with the diagnosis of ROTATOR CUFF TEAR RIGHT SHOULDER   The various methods of treatment have been discussed with the patient and family. After consideration of risks, benefits and other options for treatment, the patient has consented to  Procedure(s): RIGHT SHOULDER ARTHROSCOPY WITH SUBACROMIAL DECOMPRESSION AND MINI OPEN ROTATOR CUFF REPAIR  (Right) as a surgical intervention .  The patient's history has been reviewed, patient examined, no change in status, stable for surgery.  I have reviewed the patient's chart and labs.  Questions were answered to the patient's satisfaction.     Clevon Khader C

## 2013-07-15 NOTE — Transfer of Care (Signed)
Immediate Anesthesia Transfer of Care Note  Patient: Suzanne Santos  Procedure(s) Performed: Procedure(s) (LRB): RIGHT SHOULDER ARTHROSCOPY WITH SUBACROMIAL DECOMPRESSION, labral and cuff debridement (Right)  Patient Location: PACU  Anesthesia Type: General  Level of Consciousness: sedated, patient cooperative and responds to stimulation  Airway & Oxygen Therapy: Patient Spontanous Breathing and Patient connected to face mask oxgen  Post-op Assessment: Report given to PACU RN and Post -op Vital signs reviewed and stable  Post vital signs: Reviewed and stable  Complications: No apparent anesthesia complications

## 2013-07-15 NOTE — Interval H&P Note (Signed)
History and Physical Interval Note:  07/15/2013 10:23 AM  Suzanne Santos  has presented today for surgery, with the diagnosis of ROTATOR CUFF TEAR RIGHT SHOULDER   The various methods of treatment have been discussed with the patient and family. After consideration of risks, benefits and other options for treatment, the patient has consented to  Procedure(s): RIGHT SHOULDER ARTHROSCOPY WITH SUBACROMIAL DECOMPRESSION AND MINI OPEN ROTATOR CUFF REPAIR  (Right) as a surgical intervention .  The patient's history has been reviewed, patient examined, no change in status, stable for surgery.  I have reviewed the patient's chart and labs.  Questions were answered to the patient's satisfaction.     Suzanne Santos C

## 2013-07-15 NOTE — Anesthesia Preprocedure Evaluation (Signed)
Anesthesia Evaluation  Patient identified by MRN, date of birth, ID band Patient awake    Reviewed: Allergy & Precautions, H&P , NPO status , Patient's Chart, lab work & pertinent test results  History of Anesthesia Complications (+) PONV  Airway Mallampati: II TM Distance: >3 FB Neck ROM: full    Dental no notable dental hx. (+) Teeth Intact and Dental Advisory Given   Pulmonary neg pulmonary ROS,  breath sounds clear to auscultation  Pulmonary exam normal       Cardiovascular Exercise Tolerance: Good negative cardio ROS  Rhythm:regular Rate:Normal     Neuro/Psych Anterior cervical disc surgery negative neurological ROS  negative psych ROS   GI/Hepatic negative GI ROS, Neg liver ROS,   Endo/Other  negative endocrine ROS  Renal/GU negative Renal ROS  negative genitourinary   Musculoskeletal   Abdominal   Peds  Hematology negative hematology ROS (+)   Anesthesia Other Findings   Reproductive/Obstetrics negative OB ROS                           Anesthesia Physical Anesthesia Plan  ASA: I  Anesthesia Plan: General   Post-op Pain Management:    Induction: Intravenous  Airway Management Planned: Oral ETT  Additional Equipment:   Intra-op Plan:   Post-operative Plan: Extubation in OR  Informed Consent: I have reviewed the patients History and Physical, chart, labs and discussed the procedure including the risks, benefits and alternatives for the proposed anesthesia with the patient or authorized representative who has indicated his/her understanding and acceptance.   Dental Advisory Given  Plan Discussed with: CRNA and Surgeon  Anesthesia Plan Comments:         Anesthesia Quick Evaluation

## 2013-07-16 ENCOUNTER — Encounter (HOSPITAL_COMMUNITY): Payer: Self-pay | Admitting: Specialist

## 2013-07-16 NOTE — Op Note (Signed)
Suzanne Santos, Suzanne Santos              ACCOUNT NO.:  [id number]  MEDICAL RECORD NO.:  [id number]  LOCATION:  [facility]                         FACILITY:  [facility]  PHYSICIAN:  Jene Every, M.D.    DATE OF BIRTH:  May 25, 1981  DATE OF PROCEDURE:  07/15/2013 DATE OF DISCHARGE:  07/15/2013                              OPERATIVE REPORT   PREOPERATIVE DIAGNOSIS:  Impingement syndrome of the rotator cuff.  POSTOPERATIVE DIAGNOSES:  Impingement syndrome of the rotator cuff, posterior labral tear.  PROCEDURE PERFORMED: 1. Exam under anesthesia. 2. Left shoulder arthroscopy with debridement of the posterior labrum. 3. Subacromial decompression. 4. Acromioplasty with CA ligament. 5. Debridement of the partial tear of the rotator cuff.  ANESTHESIA:  General.  ASSISTANT:  Lanna Poche, PA-C.  There was required for retraction in the upper extremity and monitoring the inflow and outflow within the pump pressure.  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  Partial tear of the rotator cuff was noted.  Indicated for arthroscopy, subacromial decompression, and possible removed with mini open rotator cuff repair.  Risks and benefits discussed including bleeding, infection, damage to neurovascular structures, DVT, PE, anesthetic complications, etc.  TECHNIQUE:  With the patient in supine in beach-chair position after the induction of adequate general anesthesia, 2 g Kefzol in the right shoulder and upper extremity was prepped and draped in usual sterile fashion.  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  She had full range.  Surgical marker was utilized to delineate the acromion, AC joint, and coracoid.  Standard posterolateral and anterolateral portals were utilized with incision through the skin only.  With the arm in  the 70/30 position, we advanced the arthroscopic camera and the glenohumeral joint penetrating atraumatically.  Irrigant was utilized to insufflate the joint.  A 65 mmHg were utilized.  Noted was a tearing of the posterior portion of the labral tear.  No tear was seen from the articular side of the cuff.  I fashioned the anterior portal with an 11 blade after localization with 18-gauge needle sparing just beneath the biceps.  I introduced the shaver and debrided the posterior labral to the stable base.  Chondral surfaces were unremarkable.  Subscap was unremarkable either.  I redirected that camera in the subacromial space, triangulated with a lateral portal into the subacromial space.  Hypertrophic bursa was noted and a decreased acromial humeral distance.  I released the morselized CA ligament,  performed an acromioplasty and a small spur on the anterolateral aspect of the acromion, a full bursectomy anteriorly and posteriorly.  The arm abducted and externally rotated.  A small 3 mm rent in the rotator cuff was noted bursal side may be 20%.  This was debrided with a shaver.  Good stable base and extensively probed.  There was no evidence of a significant and full-thickness tear.  We felt that with the increase in the subacromial space, an acromioplasty and bursectomy of this would heal without requiring the repair.  I examined the entire cuff and probed with no evidence of residual pathology.  I therefore removed  all instrumentation.  Portals were closed with 4-0 nylon simple sutures.  A 0.25% Marcaine with epinephrine was infiltrated into the joint.  Wound was dressed sterilely, placed in a sling, extubated without difficulty, and transported to the recovery room in satisfactory condition.  The patient tolerated the procedure well.  No complications.  Minimal blood loss.     Jene Every, M.D.     Cordelia Pen  D:  07/15/2013  T:  07/16/2013  Job:  [id number]

## 2014-07-11 ENCOUNTER — Encounter (HOSPITAL_COMMUNITY): Payer: Self-pay | Admitting: Specialist

## 2014-09-07 IMAGING — CR DG SHOULDER 2+V*R*
3 series · 3 of 3 positions shown · non-contrast
Comparison: None.

CLINICAL DATA: Right shoulder pain since a lifting injury
05/01/2013.  Limited range of motion.

RIGHT SHOULDER - 2+ VIEW

[view not recorded (1 of 3)]
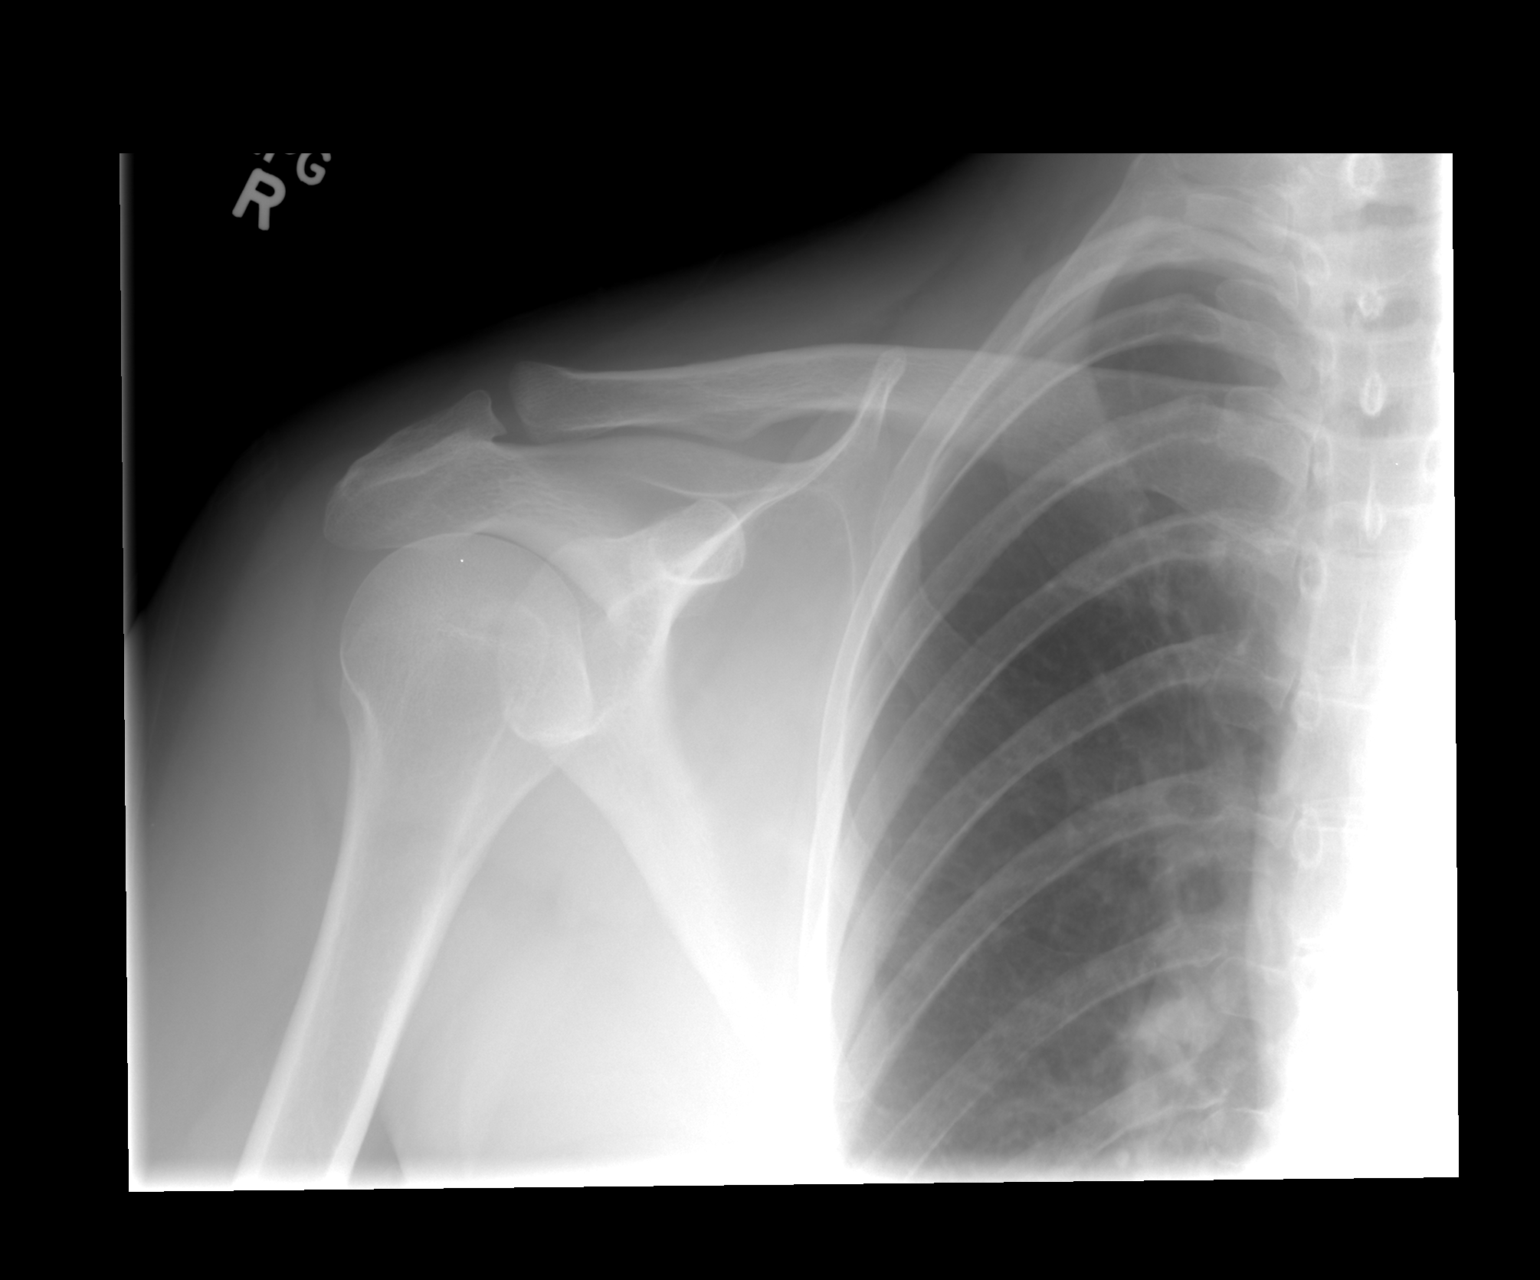

[view not recorded (2 of 3)]
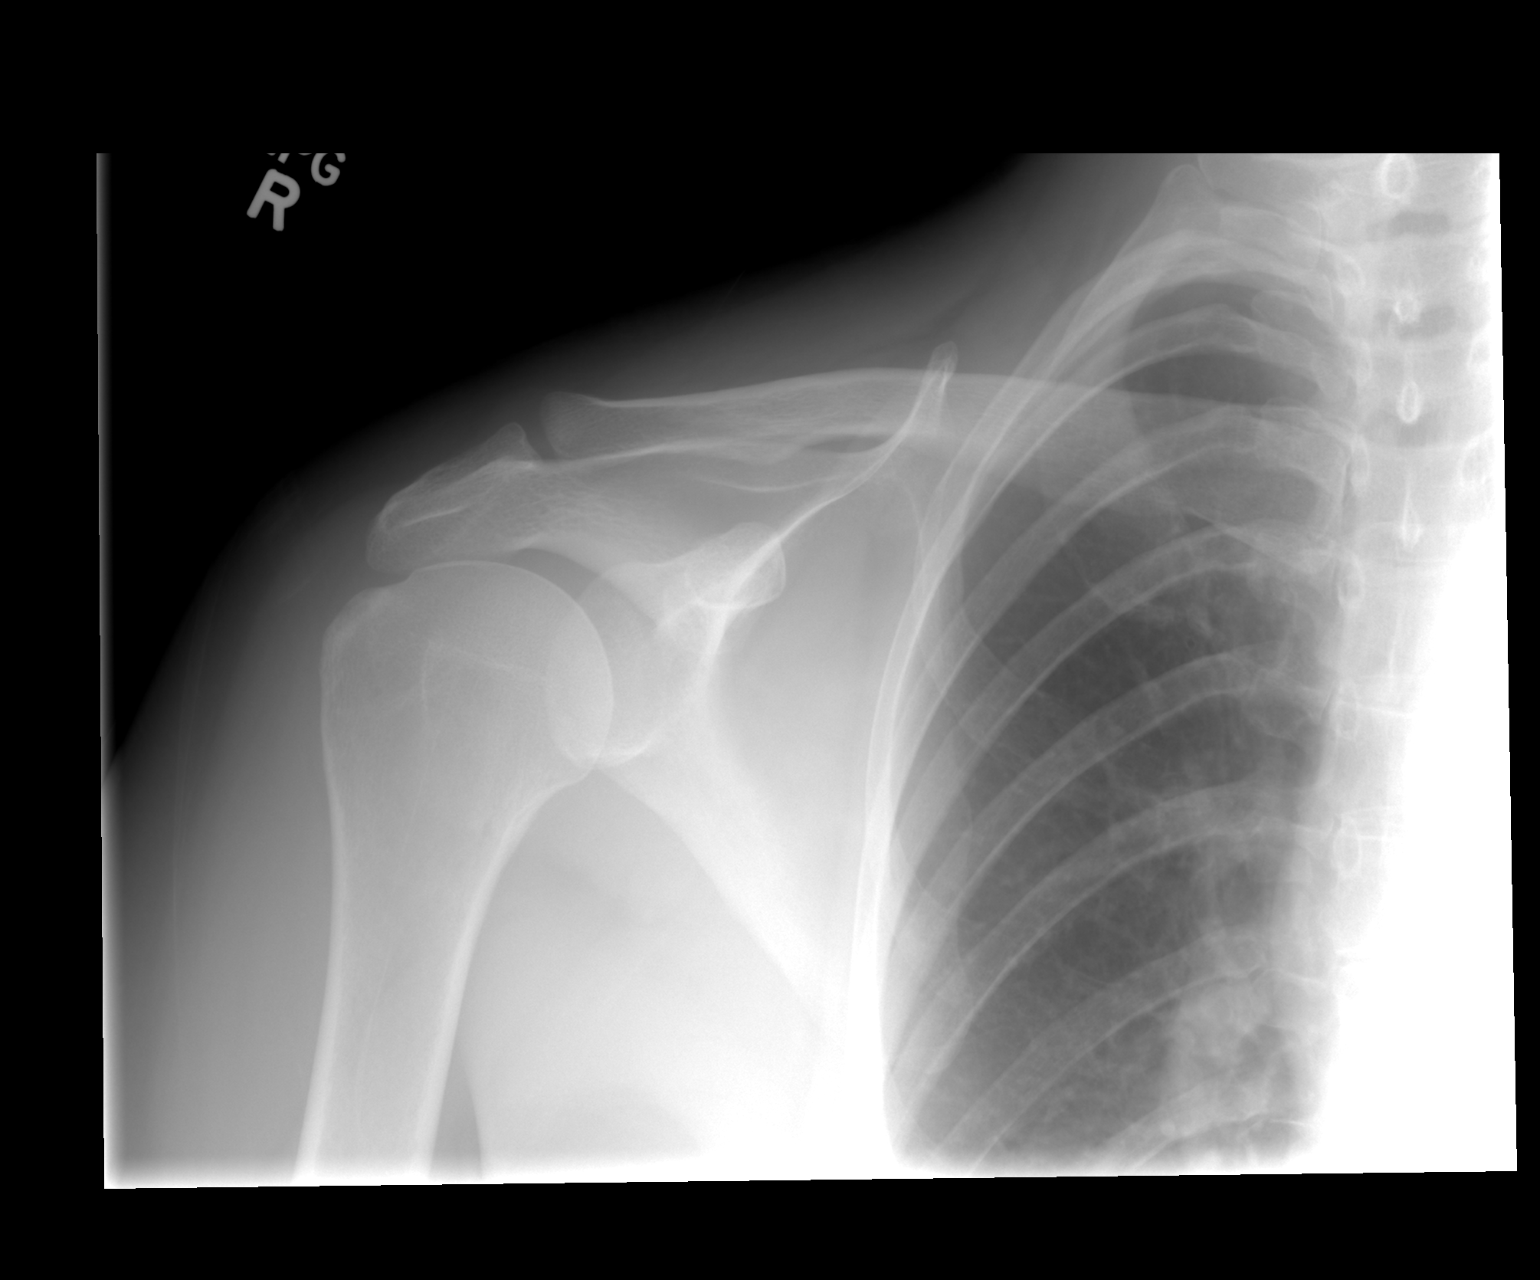

[view not recorded (3 of 3)]
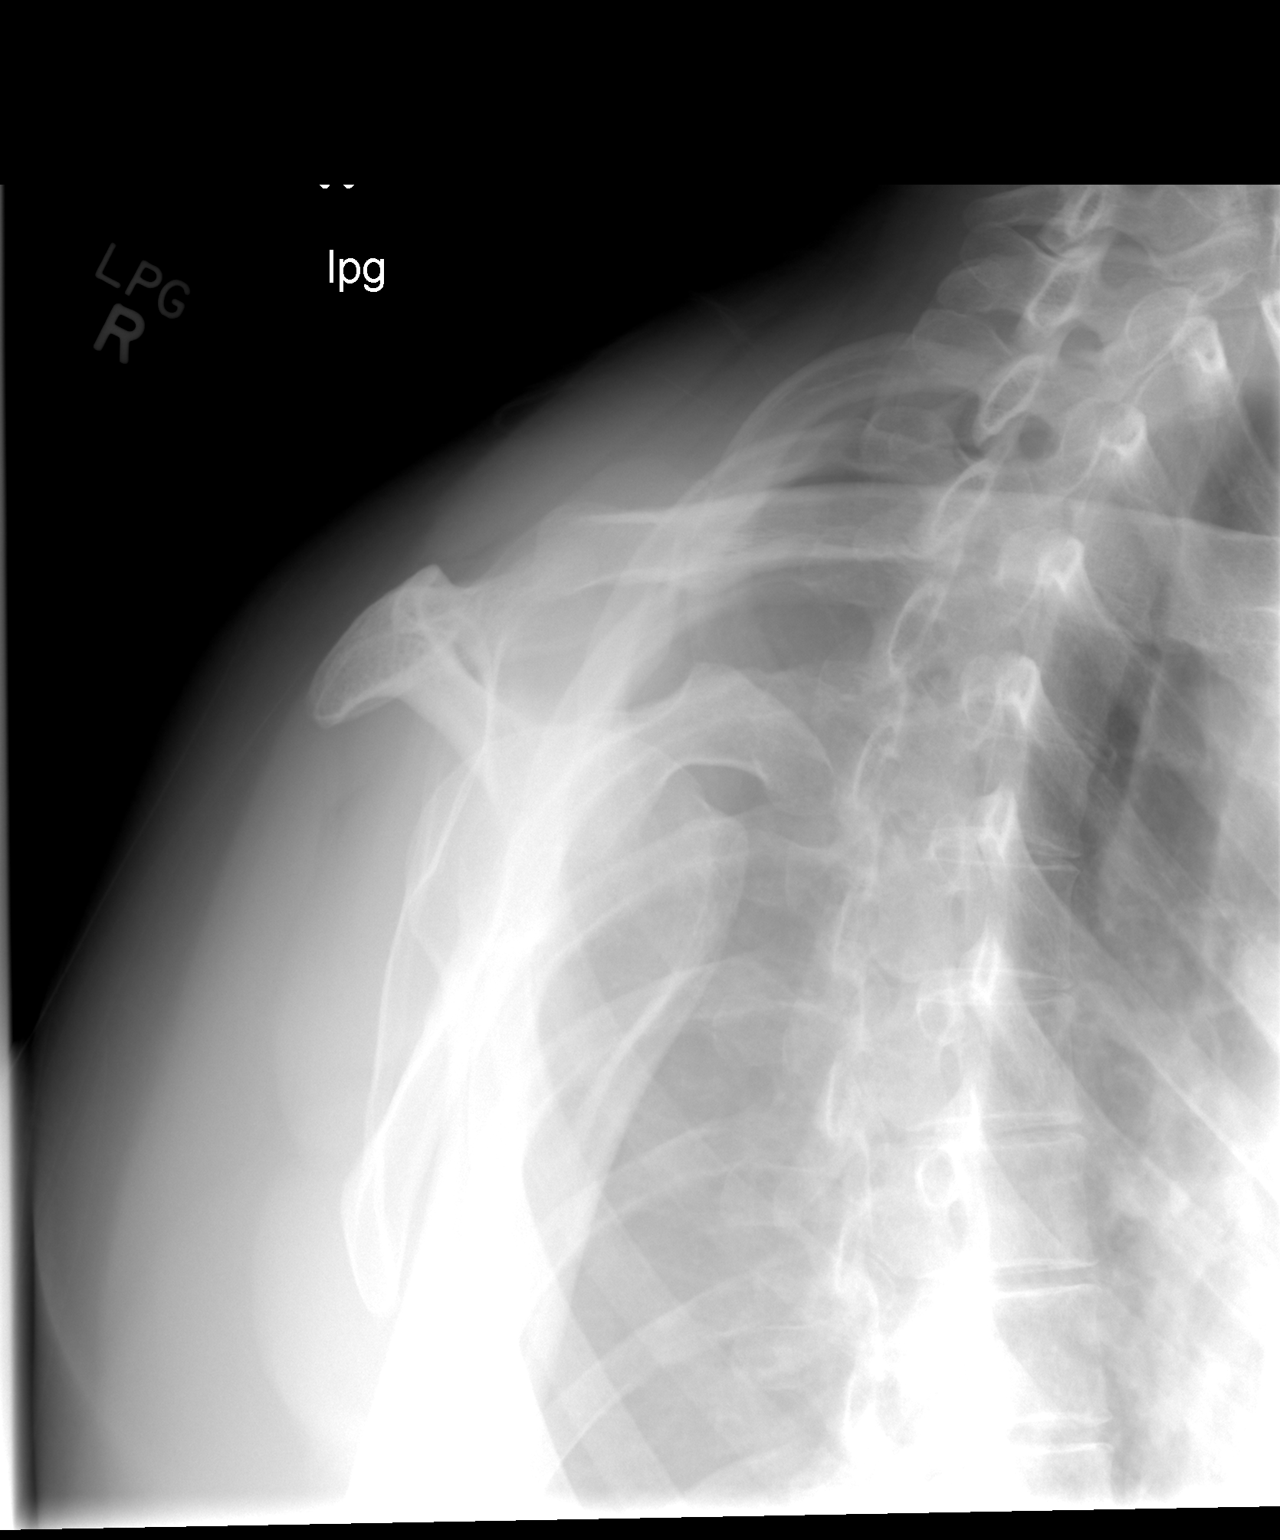

[3 of 3 positions shown; findings below may reference images not displayed]

FINDINGS: Imaged bones, joints and soft tissues appear normal.
IMPRESSION: Negative exam.

## 2018-09-23 ENCOUNTER — Encounter (HOSPITAL_COMMUNITY): Payer: Self-pay

## 2018-09-23 ENCOUNTER — Other Ambulatory Visit: Payer: Self-pay

## 2018-09-23 ENCOUNTER — Emergency Department (HOSPITAL_COMMUNITY)
Admission: EM | Admit: 2018-09-23 | Discharge: 2018-09-24 | Disposition: A | Discharge status: Home / Self Care | Payer: PRIVATE HEALTH INSURANCE | Attending: Emergency Medicine | Admitting: Emergency Medicine

## 2018-09-23 DIAGNOSIS — N3001 Acute cystitis with hematuria: Secondary | ICD-10-CM

## 2018-09-23 DIAGNOSIS — I10 Essential (primary) hypertension: Secondary | ICD-10-CM | POA: Insufficient documentation

## 2018-09-23 DIAGNOSIS — B3731 Acute candidiasis of vulva and vagina: Secondary | ICD-10-CM

## 2018-09-23 DIAGNOSIS — Z3A08 8 weeks gestation of pregnancy: Secondary | ICD-10-CM | POA: Diagnosis not present

## 2018-09-23 DIAGNOSIS — Z79899 Other long term (current) drug therapy: Secondary | ICD-10-CM | POA: Diagnosis not present

## 2018-09-23 DIAGNOSIS — B373 Candidiasis of vulva and vagina: Secondary | ICD-10-CM | POA: Diagnosis not present

## 2018-09-23 DIAGNOSIS — Z3491 Encounter for supervision of normal pregnancy, unspecified, first trimester: Secondary | ICD-10-CM | POA: Diagnosis not present

## 2018-09-23 DIAGNOSIS — O26891 Other specified pregnancy related conditions, first trimester: Secondary | ICD-10-CM | POA: Insufficient documentation

## 2018-09-23 DIAGNOSIS — R300 Dysuria: Secondary | ICD-10-CM | POA: Diagnosis not present

## 2018-09-23 HISTORY — DX: Essential (primary) hypertension: I10

## 2018-09-23 NOTE — ED Triage Notes (Signed)
Pt c/o of abdominal pain and spotting that started tonight. currently [redacted] weeks pregnant. Has also started having burning and urgency when urinating. Has first OB apt Friday.

## 2018-09-24 DIAGNOSIS — O26891 Other specified pregnancy related conditions, first trimester: Secondary | ICD-10-CM | POA: Diagnosis not present

## 2018-09-24 LAB — CBC WITH DIFFERENTIAL/PLATELET
Abs Immature Granulocytes: 0.03 10*3/uL (ref 0.00–0.07)
Basophils Absolute: 0 10*3/uL (ref 0.0–0.1)
Basophils Relative: 0 %
Eosinophils Absolute: 0 10*3/uL (ref 0.0–0.5)
Eosinophils Relative: 0 %
HCT: 37.5 % (ref 36.0–46.0)
Hemoglobin: 11.6 g/dL — ABNORMAL LOW (ref 12.0–15.0)
Immature Granulocytes: 0 %
Lymphocytes Relative: 17 %
Lymphs Abs: 1.8 10*3/uL (ref 0.7–4.0)
MCH: 30.2 pg (ref 26.0–34.0)
MCHC: 30.9 g/dL (ref 30.0–36.0)
MCV: 97.7 fL (ref 80.0–100.0)
MONO ABS: 0.5 10*3/uL (ref 0.1–1.0)
Monocytes Relative: 5 %
NEUTROS ABS: 8.5 10*3/uL — AB (ref 1.7–7.7)
Neutrophils Relative %: 78 %
Platelets: 237 10*3/uL (ref 150–400)
RBC: 3.84 MIL/uL — ABNORMAL LOW (ref 3.87–5.11)
RDW: 12.8 % (ref 11.5–15.5)
WBC: 10.9 10*3/uL — ABNORMAL HIGH (ref 4.0–10.5)
nRBC: 0 % (ref 0.0–0.2)

## 2018-09-24 LAB — COMPREHENSIVE METABOLIC PANEL
ALT: 19 U/L (ref 0–44)
ANION GAP: 9 (ref 5–15)
AST: 17 U/L (ref 15–41)
Albumin: 4.5 g/dL (ref 3.5–5.0)
Alkaline Phosphatase: 54 U/L (ref 38–126)
BUN: 13 mg/dL (ref 6–20)
CO2: 20 mmol/L — AB (ref 22–32)
Calcium: 9.2 mg/dL (ref 8.9–10.3)
Chloride: 111 mmol/L (ref 98–111)
Creatinine, Ser: 0.61 mg/dL (ref 0.44–1.00)
Glucose, Bld: 77 mg/dL (ref 70–99)
POTASSIUM: 3.4 mmol/L — AB (ref 3.5–5.1)
SODIUM: 140 mmol/L (ref 135–145)
Total Bilirubin: 0.6 mg/dL (ref 0.3–1.2)
Total Protein: 7.5 g/dL (ref 6.5–8.1)

## 2018-09-24 LAB — URINALYSIS, ROUTINE W REFLEX MICROSCOPIC
BILIRUBIN URINE: NEGATIVE
Glucose, UA: NEGATIVE mg/dL
Ketones, ur: NEGATIVE mg/dL
NITRITE: NEGATIVE
PROTEIN: NEGATIVE mg/dL
Specific Gravity, Urine: 1.004 — ABNORMAL LOW (ref 1.005–1.030)
pH: 7 (ref 5.0–8.0)

## 2018-09-24 LAB — WET PREP, GENITAL
SPERM: NONE SEEN
Trich, Wet Prep: NONE SEEN

## 2018-09-24 LAB — HCG, QUANTITATIVE, PREGNANCY: HCG, BETA CHAIN, QUANT, S: 40302 m[IU]/mL — AB (ref <5)

## 2018-09-24 MED ORDER — CEPHALEXIN 500 MG PO CAPS
500.0000 mg | ORAL_CAPSULE | Freq: Once | ORAL | Status: AC
  Administered 2018-09-24: 500 mg via ORAL
  Filled 2018-09-24: qty 1

## 2018-09-24 MED ORDER — CEPHALEXIN 500 MG PO CAPS: 500.0000 mg | ORAL_CAPSULE | Freq: Four times a day (QID) | ORAL | 0 refills | Status: DC

## 2018-09-24 MED ORDER — PHENAZOPYRIDINE HCL 200 MG PO TABS: 200.0000 mg | ORAL_TABLET | Freq: Three times a day (TID) | ORAL | 0 refills | Status: DC

## 2018-09-24 MED ORDER — PHENAZOPYRIDINE HCL 100 MG PO TABS
200.0000 mg | ORAL_TABLET | Freq: Once | ORAL | Status: AC
Start: 2018-09-24 — End: 2018-09-24
  Administered 2018-09-24: 200 mg via ORAL
  Filled 2018-09-24: qty 2

## 2018-09-24 NOTE — ED Provider Notes (Signed)
[facility] EMERGENCY DEPARTMENT Provider Note   CSN: [id number] Arrival date & time: 09/23/18  2331     History   Chief Complaint Chief Complaint  Patient presents with  . Abdominal Pain    8 wk preg    HPI Suzanne Santos is a 38 y.o. [id number] @ approximately [redacted] weeks gestation by LMP of 07/19/18 who presents to the ED with c/o abdominal pain and spotting in early pregnancy that started a few days ago but stopped. Tonight patient reports cramping and started having dysuria, frequency and one episode of n/v. Patient has her first OB appointment scheduled this week. Patient also reports dysuria.   HPI  Past Medical History:  Diagnosis Date  . Complication of anesthesia   . Depression    PREVENTITIVE  . HSV (herpes simplex virus) infection    treated with Valtrex  . Hypertension   . Infertility, female    conceived on bravelle  . PONV (postoperative nausea and vomiting)     Patient Active Problem List   Diagnosis Date Noted  . Impingement syndrome of right shoulder 07/15/2013    Past Surgical History:  Procedure Laterality Date  . ANTERIOR CERVICAL DECOMP/DISCECTOMY FUSION  11/08/2011   Procedure: ANTERIOR CERVICAL DECOMPRESSION/DISCECTOMY FUSION 1 LEVEL;  Surgeon: Karn Cassis, MD;  Location: [facility];  Service: Neurosurgery;  Laterality: N/A;  Cervical Five-Six Anterior Cervical decompression/diskectomy fusion  . CHOLECYSTECTOMY    . SHOULDER ARTHROSCOPY WITH OPEN ROTATOR CUFF REPAIR Right 07/15/2013   Procedure: RIGHT SHOULDER ARTHROSCOPY WITH SUBACROMIAL DECOMPRESSION, labral and cuff debridement;  Surgeon: Javier Docker, MD;  Location: [facility];  Service: Orthopedics;  Laterality: Right;     OB History    Gravida  3   Para  2   Term  2   Preterm      AB      Living  2     SAB      TAB      Ectopic      Multiple      Live Births  2            Home Medications    Prior to Admission medications   Medication Sig Start Date End Date  Taking? Authorizing Provider  cephALEXin (KEFLEX) 500 MG capsule Take 1 capsule (500 mg total) by mouth 4 (four) times daily. 09/24/18   Janne Napoleon, NP  citalopram (CELEXA) 20 MG tablet Take 20 mg by mouth every morning.     [provider]  DULoxetine (CYMBALTA) 60 MG capsule Take 60 mg by mouth 2 (two) times daily.    [provider]  HYDROcodone-acetaminophen (NORCO/VICODIN) 5-325 MG per tablet Take 1 tablet by mouth every 6 (six) hours as needed for pain.    [provider]  ibuprofen (ADVIL,MOTRIN) 800 MG tablet Take 800 mg by mouth every 8 (eight) hours as needed for pain.    [provider]  ketorolac (TORADOL) 10 MG tablet Take 1 tablet (10 mg total) by mouth every 6 (six) hours as needed. 07/15/13   Jene Every, MD  oxyCODONE-acetaminophen (PERCOCET) 5-325 MG per tablet Take 1-2 tablets by mouth every 4 (four) hours as needed. 07/15/13   Jene Every, MD  phenazopyridine (PYRIDIUM) 200 MG tablet Take 1 tablet (200 mg total) by mouth 3 (three) times daily. 09/24/18   Janne Napoleon, NP  Prenatal Vit-Fe Fumarate-FA (MULTIVITAMIN-PRENATAL) 27-0.8 MG TABS tablet Take 1 tablet by mouth daily at 12 noon.  [provider]    Family History History reviewed. No pertinent family history.  Social History Social History   Tobacco Use  . Smoking status: Never Smoker  . Smokeless tobacco: Never Used  Substance Use Topics  . Alcohol use: No    Comment: occassionally  . Drug use: No     Allergies   Patient has no known allergies.   Review of Systems Review of Systems  Constitutional: Negative for chills and fever.  HENT: Negative.   Eyes: Negative for visual disturbance.  Respiratory: Negative for shortness of breath.   Cardiovascular: Negative for chest pain.  Gastrointestinal: Positive for abdominal pain (cramping) and vomiting (x1).  Genitourinary: Positive for dysuria, frequency and urgency. Negative for vaginal bleeding.    Musculoskeletal: Negative for gait problem.  Skin: Negative for rash.  Neurological: Negative for headaches.  Psychiatric/Behavioral: Negative for confusion.     Physical Exam Updated Vital Signs BP (!) 156/96 (BP Location: Left Arm)   Pulse 68   Temp 98 F (36.7 C) (Oral)   Resp 18   Ht 5\' 7"  (1.702 m)   Wt 70.8 kg   LMP 07/19/2018   SpO2 100%   BMI 24.43 kg/m   Physical Exam Vitals signs and nursing note reviewed.  Constitutional:      General: She is not in acute distress.    Appearance: She is well-developed.  HENT:     Head: Normocephalic.  Neck:     Musculoskeletal: Neck supple.  Cardiovascular:     Rate and Rhythm: Normal rate.  Pulmonary:     Effort: Pulmonary effort is normal.  Abdominal:     Palpations: Abdomen is soft.     Tenderness: There is no abdominal tenderness.  Genitourinary:    Cervix: No cervical motion tenderness.     Adnexa: Right adnexa normal and left adnexa normal.     Comments: External genitalia without lesions, thick white cheesy d/c vaginal vault, no CMT, no adnexal tenderness. Uterus without palpable enlargement.  Musculoskeletal: Normal range of motion.  Skin:    General: Skin is warm and dry.  Neurological:     Mental Status: She is alert and oriented to person, place, and time.     Cranial Nerves: No cranial nerve deficit.      ED Treatments / Results  Labs (all labs ordered are listed, but only abnormal results are displayed) Labs Reviewed  WET PREP, GENITAL - Abnormal; Notable for the following components:      Result Value   Yeast Wet Prep HPF POC PRESENT (*)    Clue Cells Wet Prep HPF POC PRESENT (*)    WBC, Wet Prep HPF POC MANY (*)    All other components within normal limits  CBC WITH DIFFERENTIAL/PLATELET - Abnormal; Notable for the following components:   WBC 10.9 (*)    RBC 3.84 (*)    Hemoglobin 11.6 (*)    Neutro Abs 8.5 (*)    All other components within normal limits  COMPREHENSIVE METABOLIC PANEL -  Abnormal; Notable for the following components:   Potassium 3.4 (*)    CO2 20 (*)    All other components within normal limits  HCG, QUANTITATIVE, PREGNANCY - Abnormal; Notable for the following components:   hCG, Beta Chain, Quant, S 40,302 (*)    All other components within normal limits  URINALYSIS, ROUTINE W REFLEX MICROSCOPIC - Abnormal; Notable for the following components:   Color, Urine STRAW (*)    Specific Gravity, Urine 1.004 (*)  Hgb urine dipstick LARGE (*)    Leukocytes, UA MODERATE (*)    WBC, UA >50 (*)    Bacteria, UA RARE (*)    All other components within normal limits  URINE CULTURE  GC/CHLAMYDIA PROBE AMP ([place] NOT [facility]   Radiology No results found.  Procedures Procedures (including critical care time)  Medications Ordered in ED Medications  cephALEXin (KEFLEX) capsule 500 mg (has no administration in time range)  phenazopyridine (PYRIDIUM) tablet 200 mg (has no administration in time range)     Initial Impression / Assessment and Plan / ED Course  I have reviewed the triage vital signs and the nursing notes. Pt has been diagnosed with a UTI. Pt is afebrile, no CVA tenderness and denies N/V since the one episode. Pt to be dc home with antibiotics and instructions to follow up with her OB as scheduled this week. Urine sent for culture.   Final Clinical Impressions(s) / ED Diagnoses   Final diagnoses:  Acute cystitis with hematuria  Monilial vaginitis  First trimester pregnancy    ED Discharge Orders         Ordered    cephALEXin (KEFLEX) 500 MG capsule  4 times daily     09/24/18 0219    phenazopyridine (PYRIDIUM) 200 MG tablet  3 times daily     09/24/18 0219           Kerrie Buffaloeese, Philipp Callegari River[place], NP 09/24/18 [phone]    Devoria AlbeKnapp, Iva, MD 09/24/18 [phone]

## 2018-09-24 NOTE — Discharge Instructions (Signed)
We have sent the urine for culture if we need to change your medication someone will call you. Keep your appointment with the OB this week.

## 2018-09-25 LAB — GC/CHLAMYDIA PROBE AMP (~~LOC~~) NOT AT ARMC
CHLAMYDIA, DNA PROBE: NEGATIVE
Neisseria Gonorrhea: NEGATIVE

## 2018-09-26 LAB — URINE CULTURE: Culture: 40000 — AB

## 2018-09-27 ENCOUNTER — Telehealth: Payer: Self-pay | Admitting: Emergency Medicine

## 2018-09-27 NOTE — Telephone Encounter (Signed)
Post ED Visit - Positive Culture Follow-up: Successful Patient Follow-Up  Culture assessed and recommendations reviewed by:  []  Enzo Bi, Pharm.D. []  Celedonio Miyamoto, Pharm.D., BCPS AQ-ID []  Garvin Fila, Pharm.D., BCPS []  Georgina Pillion, Pharm.D., BCPS []  [place], [address].D., BCPS, AAHIVP []  Estella Husk, Pharm.D., BCPS, AAHIVP []  Lysle Pearl, PharmD, BCPS []  Phillips Climes, PharmD, BCPS []  Agapito Games, PharmD, BCPS [x]  Verlan Friends, PharmD  Positive Urine culture  []  Patient discharged without antimicrobial prescription and treatment is now indicated [x]  Organism is resistant to prescribed ED discharge antimicrobial []  Patient with positive blood cultures  Changes discussed with ED provider: Ebbie Ridge PA New antibiotic prescription: Fosfomycin three gram PO x one             Metronidazole 500 mg PO BID x seven days  Called to [facility], [phone]  Contacted patient, date 09/27/2018, time 1450   [employer] RN 09/27/2018, 2:53 PM

## 2018-09-27 NOTE — Progress Notes (Signed)
ED Antimicrobial Stewardship Positive Culture Follow Up   Suzanne Santos is an 38 y.o. female who presented to [facility] on 09/23/2018 with a chief complaint of  Chief Complaint  Patient presents with  . Abdominal Pain    8 wk preg    Recent Results (from the past 720 hour(s))  Urine culture     Status: Abnormal   Collection Time: 09/24/18 12:43 AM  Result Value Ref Range Status   Specimen Description   Final    URINE, RANDOM Performed at [facility], [address] [place], [place] [address]    Special Requests   Final    NONE Performed at [facility], [address], [place], [place] [address]    Culture 40,000 COLONIES/mL STAPHYLOCOCCUS SAPROPHYTICUS (A)  Final   Report Status 09/26/2018 FINAL  Final   Organism ID, Bacteria STAPHYLOCOCCUS SAPROPHYTICUS (A)  Final      Susceptibility   Staphylococcus saprophyticus - MIC*    CIPROFLOXACIN <=0.5 SENSITIVE Sensitive     GENTAMICIN <=0.5 SENSITIVE Sensitive     NITROFURANTOIN <=16 SENSITIVE Sensitive     OXACILLIN 2 RESISTANT Resistant     TETRACYCLINE <=1 SENSITIVE Sensitive     VANCOMYCIN 1 SENSITIVE Sensitive     TRIMETH/SULFA <=10 SENSITIVE Sensitive     CLINDAMYCIN >=8 RESISTANT Resistant     RIFAMPIN <=0.5 SENSITIVE Sensitive     Inducible Clindamycin NEGATIVE Sensitive     * 40,000 COLONIES/mL STAPHYLOCOCCUS SAPROPHYTICUS  Wet prep, genital     Status: Abnormal   Collection Time: 09/24/18  1:42 AM  Result Value Ref Range Status   Yeast Wet Prep HPF POC PRESENT (A) NONE SEEN Final   Trich, Wet Prep NONE SEEN NONE SEEN Final   Clue Cells Wet Prep HPF POC PRESENT (A) NONE SEEN Final   WBC, Wet Prep HPF POC MANY (A) NONE SEEN Final   Sperm NONE SEEN  Final    Comment: Performed at [facility], [address], [place], [place] [address]    [x]  Treated with cephalexin, organism resistant to prescribed antimicrobial  Patient is [redacted] weeks pregnant, therefore antibiotic options for UTI are limited. Patient also has  clue cells present on wet prep and needs treatment for bacterial vaginosis. Recommend follow up with OB/GYN following completion of treatment for symptom check and repeat cultures.  New antibiotic prescriptions: Fosfomycin 3g PO x1 dose AND metronidazole 500mg  PO BID x7 days  ED Provider: Ebbie Ridge, PA-C  Roderic Scarce. Zigmund Daniel, PharmD, BCPS PGY2 Infectious Diseases Pharmacy Resident Phone: [phone] 09/27/2018, 9:57 AM

## 2021-06-05 ENCOUNTER — Other Ambulatory Visit: Payer: Self-pay | Admitting: Orthopedic Surgery

## 2021-06-15 NOTE — Progress Notes (Signed)
Surgical Instructions    Your procedure is scheduled on 06/20/21.  Report to [facility] Main Entrance "A" at 10:00 A.M., then check in with the Admitting office.  Call this number if you have problems the morning of surgery:  [phone]   If you have any questions prior to your surgery date call [phone]: Open Monday-Friday 8am-4pm    Remember:  Do not eat after midnight the night before your surgery  You may drink clear liquids until 10:00am the morning of your surgery.   Clear liquids allowed are: Water, Non-Citrus Juices (without pulp), Carbonated Beverages, Clear Tea, Black Coffee ONLY (NO MILK, CREAM OR POWDERED CREAMER of any kind), and Gatorade  Patient Instructions  The night before surgery:  No food after midnight. ONLY clear liquids after midnight  The day of surgery (if you do NOT have diabetes):  Drink ONE (1) Pre-Surgery Clear Ensure by 10:00am the morning of surgery. Drink in one sitting. Do not sip.  This drink was given to you during your hospital  pre-op appointment visit. Nothing else to drink after completing the  Pre-Surgery Clear Ensure.         If you have questions, please contact your surgeon's office.     Take these medicines the morning of surgery with A SIP OF WATER  cephALEXin (KEFLEX) citalopram (CELEXA)  DULoxetine (CYMBALTA)  HYDROcodone-acetaminophen (NORCO/VICODIN) if needed phenazopyridine (PYRIDIUM) oxyCODONE-acetaminophen (PERCOCET)  if needed   As of today, STOP taking any Aspirin (unless otherwise instructed by your surgeon) Aleve, Naproxen, Ibuprofen, Motrin, Advil, Goody's, BC's, all herbal medications, fish oil, ketorolac (TORADOL) and all vitamins.     After your COVID test   You are not required to quarantine however you are required to wear a well-fitting mask when you are out and around people not in your household.  If your mask becomes wet or soiled, replace with a new one.  Wash your hands often with soap and  water for 20 seconds or clean your hands with an alcohol-based hand sanitizer that contains at least 60% alcohol.  Do not share personal items.  Notify your provider: if you are in close contact with someone who has COVID  or if you develop a fever of 100.4 or greater, sneezing, cough, sore throat, shortness of breath or body aches.             Do not wear jewelry or makeup Do not wear lotions, powders, perfumes/colognes, or deodorant. Do not shave 48 hours prior to surgery.   Do not bring valuables to the hospital. DO Not wear nail polish, gel polish, artificial nails, or any other type of covering on natural nails including finger and toenails. If patients have artificial nails, gel coating, etc. that need to be removed by a nail salon, please have this removed prior to surgery or surgery may need to be canceled/delayed if the surgeon/ anesthesia feels like the patient is unable to be adequately monitored.              is not responsible for any belongings or valuables.  Do NOT Smoke (Tobacco/Vaping)  24 hours prior to your procedure  If you use a CPAP at night, you may bring your mask for your overnight stay.   Contacts, glasses, hearing aids, dentures or partials may not be worn into surgery, please bring cases for these belongings   For patients admitted to the hospital, discharge time will be determined by your treatment team.   Patients discharged the day  of surgery will not be allowed to drive home, and someone needs to stay with them for 24 hours.  NO VISITORS WILL BE ALLOWED IN PRE-OP WHERE PATIENTS ARE PREPPED FOR SURGERY.  ONLY 1 SUPPORT PERSON MAY BE PRESENT IN THE WAITING ROOM WHILE YOU ARE IN SURGERY.  IF YOU ARE TO BE ADMITTED, ONCE YOU ARE IN YOUR ROOM YOU WILL BE ALLOWED TWO (2) VISITORS. 1 (ONE) VISITOR MAY STAY OVERNIGHT BUT MUST ARRIVE TO THE ROOM BY 8pm.  Minor children may have two parents present. Special consideration for safety and communication  needs will be reviewed on a case by case basis.  Special instructions:    Oral Hygiene is also important to reduce your risk of infection.  Remember - BRUSH YOUR TEETH THE MORNING OF SURGERY WITH YOUR REGULAR TOOTHPASTE   - Preparing For Surgery  Before surgery, you can play an important role. Because skin is not sterile, your skin needs to be as free of germs as possible. You can reduce the number of germs on your skin by washing with CHG (chlorahexidine gluconate) Soap before surgery.  CHG is an antiseptic cleaner which kills germs and bonds with the skin to continue killing germs even after washing.     Please do not use if you have an allergy to CHG or antibacterial soaps. If your skin becomes reddened/irritated stop using the CHG.  Do not shave (including legs and underarms) for at least 48 hours prior to first CHG shower. It is OK to shave your face.  Please follow these instructions carefully.     Shower the NIGHT BEFORE SURGERY and the MORNING OF SURGERY with CHG Soap.   If you chose to wash your hair, wash your hair first as usual with your normal shampoo. After you shampoo, rinse your hair and body thoroughly to remove the shampoo.  Then [employer] and genitals (private parts) with your normal soap and rinse thoroughly to remove soap.  After that Use CHG Soap as you would any other liquid soap. You can apply CHG directly to the skin and wash gently with a scrungie or a clean washcloth.   Apply the CHG Soap to your body ONLY FROM THE NECK DOWN.  Do not use on open wounds or open sores. Avoid contact with your eyes, ears, mouth and genitals (private parts). Wash Face and genitals (private parts)  with your normal soap.   Wash thoroughly, paying special attention to the area where your surgery will be performed.  Thoroughly rinse your body with warm water from the neck down.  DO NOT shower/wash with your normal soap after using and rinsing off the CHG Soap.  Pat  yourself dry with a CLEAN TOWEL.  Wear CLEAN PAJAMAS to bed the night before surgery  Place CLEAN SHEETS on your bed the night before your surgery  DO NOT SLEEP WITH PETS.   Day of Surgery: Take a shower with CHG soap. Wear Clean/Comfortable clothing the morning of surgery Do not apply any deodorants/lotions.   Remember to brush your teeth WITH YOUR REGULAR TOOTHPASTE.   Please read over the following fact sheets that you were given.

## 2021-06-18 ENCOUNTER — Encounter (HOSPITAL_COMMUNITY): Payer: Self-pay

## 2021-06-18 ENCOUNTER — Other Ambulatory Visit: Payer: Self-pay

## 2021-06-18 ENCOUNTER — Encounter (HOSPITAL_COMMUNITY)
Admission: RE | Admit: 2021-06-18 | Discharge: 2021-06-18 | Disposition: A | Discharge status: Home / Self Care | Payer: No Typology Code available for payment source | Source: Ambulatory Visit | Attending: Orthopedic Surgery | Admitting: Orthopedic Surgery

## 2021-06-18 DIAGNOSIS — Z01818 Encounter for other preprocedural examination: Secondary | ICD-10-CM | POA: Insufficient documentation

## 2021-06-18 DIAGNOSIS — Z20822 Contact with and (suspected) exposure to covid-19: Secondary | ICD-10-CM | POA: Diagnosis not present

## 2021-06-18 LAB — COMPREHENSIVE METABOLIC PANEL
ALT: 38 U/L (ref 0–44)
AST: 21 U/L (ref 15–41)
Albumin: 3.9 g/dL (ref 3.5–5.0)
Alkaline Phosphatase: 160 U/L — ABNORMAL HIGH (ref 38–126)
Anion gap: 8 (ref 5–15)
BUN: 13 mg/dL (ref 6–20)
CO2: 23 mmol/L (ref 22–32)
Calcium: 8.4 mg/dL — ABNORMAL LOW (ref 8.9–10.3)
Chloride: 105 mmol/L (ref 98–111)
Creatinine, Ser: 0.96 mg/dL (ref 0.44–1.00)
GFR, Estimated: >60 mL/min (ref >60)
Glucose, Bld: 91 mg/dL (ref 70–99)
Potassium: 3.8 mmol/L (ref 3.5–5.1)
Sodium: 136 mmol/L (ref 135–145)
Total Bilirubin: 0.7 mg/dL (ref 0.3–1.2)
Total Protein: 6.8 g/dL (ref 6.5–8.1)

## 2021-06-18 LAB — URINALYSIS, ROUTINE W REFLEX MICROSCOPIC
Bilirubin Urine: NEGATIVE
Glucose, UA: NEGATIVE mg/dL
Hgb urine dipstick: NEGATIVE
Ketones, ur: NEGATIVE mg/dL
Leukocytes,Ua: NEGATIVE
Nitrite: NEGATIVE
Protein, ur: NEGATIVE mg/dL
Specific Gravity, Urine: 1.019 (ref 1.005–1.030)
pH: 5 (ref 5.0–8.0)

## 2021-06-18 LAB — PROTIME-INR
INR: 1 (ref 0.8–1.2)
Prothrombin Time: 12.7 seconds (ref 11.4–15.2)

## 2021-06-18 LAB — CBC WITH DIFFERENTIAL/PLATELET
Abs Immature Granulocytes: 0.02 10*3/uL (ref 0.00–0.07)
Basophils Absolute: 0 10*3/uL (ref 0.0–0.1)
Basophils Relative: 0 %
Eosinophils Absolute: 0 10*3/uL (ref 0.0–0.5)
Eosinophils Relative: 0 %
HCT: 39.5 % (ref 36.0–46.0)
Hemoglobin: 12.5 g/dL (ref 12.0–15.0)
Immature Granulocytes: 0 %
Lymphocytes Relative: 23 %
Lymphs Abs: 1.5 10*3/uL (ref 0.7–4.0)
MCH: 29.8 pg (ref 26.0–34.0)
MCHC: 31.6 g/dL (ref 30.0–36.0)
MCV: 94.3 fL (ref 80.0–100.0)
Monocytes Absolute: 0.3 10*3/uL (ref 0.1–1.0)
Monocytes Relative: 5 %
Neutro Abs: 4.6 10*3/uL (ref 1.7–7.7)
Neutrophils Relative %: 72 %
Platelets: 304 10*3/uL (ref 150–400)
RBC: 4.19 MIL/uL (ref 3.87–5.11)
RDW: 12.5 % (ref 11.5–15.5)
WBC: 6.4 10*3/uL (ref 4.0–10.5)
nRBC: 0 % (ref 0.0–0.2)

## 2021-06-18 LAB — TYPE AND SCREEN
ABO/RH(D): B POS
Antibody Screen: NEGATIVE

## 2021-06-18 LAB — SARS CORONAVIRUS 2 (TAT 6-24 HRS): SARS Coronavirus 2: NEGATIVE

## 2021-06-18 LAB — SURGICAL PCR SCREEN
MRSA, PCR: NEGATIVE
Staphylococcus aureus: NEGATIVE

## 2021-06-18 LAB — APTT: aPTT: 28 seconds (ref 24–36)

## 2021-06-18 NOTE — Progress Notes (Signed)
PCP -Suzanne Hopping MD Cardiologist - none  PPM/ICD - denies Device Orders -  Rep Notified -   Chest x-ray - none EKG -  Stress Test -  ECHO -  Cardiac Cath -   Sleep Study - no CPAP - no  Fasting Blood Sugar - n/a Checks Blood Sugar _____ times a day  Blood Thinner Instructions:n/a Aspirin Instructions:n/a  ERAS Protcol -clear liquids  until 1000 PRE-SURGERY Ensure or G2- Ensure  COVID TEST- 06/18/21   Anesthesia review: no  Patient denies shortness of breath, fever, cough and chest pain at PAT appointment   All instructions explained to the patient, with a verbal understanding of the material. Patient agrees to go over the instructions while at home for a better understanding. Patient also instructed to self quarantine after being tested for COVID-19. The opportunity to ask questions was provided.

## 2021-06-18 NOTE — Progress Notes (Signed)
Surgical Instructions    Your procedure is scheduled on 06/20/21.  Report to [facility] Main Entrance "A" at 10:00 A.M., then check in with the Admitting office.  Call this number if you have problems the morning of surgery:  [phone]   If you have any questions prior to your surgery date call [phone]: Open Monday-Friday 8am-4pm    Remember:  Do not eat after midnight the night before your surgery  You may drink clear liquids until 10:00am the morning of your surgery.   Clear liquids allowed are: Water, Non-Citrus Juices (without pulp), Carbonated Beverages, Clear Tea, Black Coffee ONLY (NO MILK, CREAM OR POWDERED CREAMER of any kind), and Gatorade  Patient Instructions  The night before surgery:  No food after midnight. ONLY clear liquids after midnight  The day of surgery (if you do NOT have diabetes):  Drink ONE (1) Pre-Surgery Clear Ensure by 10:00am the morning of surgery. Drink in one sitting. Do not sip.  This drink was given to you during your hospital  pre-op appointment visit. Nothing else to drink after completing the  Pre-Surgery Clear Ensure.         If you have questions, please contact your surgeon's office.     Take these medicines the morning of surgery with A SIP OF WATER :  acetaminophen (TYLENOL) if needed buPROPion (WELLBUTRIN XL)  CARTIA XT HYDROcodone-acetaminophen (NORCO/VICODIN)     As of today, STOP taking any Aspirin (unless otherwise instructed by your surgeon) Aleve, Naproxen, Ibuprofen, Motrin, Advil, Goody's, BC's, all herbal medications, fish oil, ketorolac (TORADOL) and all vitamins.     After your COVID test   You are not required to quarantine however you are required to wear a well-fitting mask when you are out and around people not in your household.  If your mask becomes wet or soiled, replace with a new one.  Wash your hands often with soap and water for 20 seconds or clean your hands with an alcohol-based hand  sanitizer that contains at least 60% alcohol.  Do not share personal items.  Notify your provider: if you are in close contact with someone who has COVID  or if you develop a fever of 100.4 or greater, sneezing, cough, sore throat, shortness of breath or body aches.             Do not wear jewelry or makeup Do not wear lotions, powders, perfumes/colognes, or deodorant. Do not shave 48 hours prior to surgery.   Do not bring valuables to the hospital. DO Not wear nail polish, gel polish, artificial nails, or any other type of covering on natural nails including finger and toenails. If patients have artificial nails, gel coating, etc. that need to be removed by a nail salon, please have this removed prior to surgery or surgery may need to be canceled/delayed if the surgeon/ anesthesia feels like the patient is unable to be adequately monitored.             [facility] is not responsible for any belongings or valuables.  Do NOT Smoke (Tobacco/Vaping)  24 hours prior to your procedure  If you use a CPAP at night, you may bring your mask for your overnight stay.   Contacts, glasses, hearing aids, dentures or partials may not be worn into surgery, please bring cases for these belongings   For patients admitted to the hospital, discharge time will be determined by your treatment team.   Patients discharged the day of surgery will not  be allowed to drive home, and someone needs to stay with them for 24 hours.  NO VISITORS WILL BE ALLOWED IN PRE-OP WHERE PATIENTS ARE PREPPED FOR SURGERY.  ONLY 1 SUPPORT PERSON MAY BE PRESENT IN THE WAITING ROOM WHILE YOU ARE IN SURGERY.  IF YOU ARE TO BE ADMITTED, ONCE YOU ARE IN YOUR ROOM YOU WILL BE ALLOWED TWO (2) VISITORS. 1 (ONE) VISITOR MAY STAY OVERNIGHT BUT MUST ARRIVE TO THE ROOM BY 8pm.  Minor children may have two parents present. Special consideration for safety and communication needs will be reviewed on a case by case basis.  Special instructions:     Oral Hygiene is also important to reduce your risk of infection.  Remember - BRUSH YOUR TEETH THE MORNING OF SURGERY WITH YOUR REGULAR TOOTHPASTE   [place]- Preparing For Surgery  Before surgery, you can play an important role. Because skin is not sterile, your skin needs to be as free of germs as possible. You can reduce the number of germs on your skin by washing with CHG (chlorahexidine gluconate) Soap before surgery.  CHG is an antiseptic cleaner which kills germs and bonds with the skin to continue killing germs even after washing.     Please do not use if you have an allergy to CHG or antibacterial soaps. If your skin becomes reddened/irritated stop using the CHG.  Do not shave (including legs and underarms) for at least 48 hours prior to first CHG shower. It is OK to shave your face.  Please follow these instructions carefully.     Shower the NIGHT BEFORE SURGERY and the MORNING OF SURGERY with CHG Soap.   If you chose to wash your hair, wash your hair first as usual with your normal shampoo. After you shampoo, rinse your hair and body thoroughly to remove the shampoo.  Then [employer] and genitals (private parts) with your normal soap and rinse thoroughly to remove soap.  After that Use CHG Soap as you would any other liquid soap. You can apply CHG directly to the skin and wash gently with a scrungie or a clean washcloth.   Apply the CHG Soap to your body ONLY FROM THE NECK DOWN.  Do not use on open wounds or open sores. Avoid contact with your eyes, ears, mouth and genitals (private parts). Wash Face and genitals (private parts)  with your normal soap.   Wash thoroughly, paying special attention to the area where your surgery will be performed.  Thoroughly rinse your body with warm water from the neck down.  DO NOT shower/wash with your normal soap after using and rinsing off the CHG Soap.  Pat yourself dry with a CLEAN TOWEL.  Wear CLEAN PAJAMAS to bed the night before  surgery  Place CLEAN SHEETS on your bed the night before your surgery  DO NOT SLEEP WITH PETS.   Day of Surgery: Take a shower with CHG soap. Wear Clean/Comfortable clothing the morning of surgery Do not apply any deodorants/lotions.   Remember to brush your teeth WITH YOUR REGULAR TOOTHPASTE.   Please read over the following fact sheets that you were given.

## 2021-06-20 ENCOUNTER — Other Ambulatory Visit: Payer: Self-pay

## 2021-06-20 ENCOUNTER — Inpatient Hospital Stay (HOSPITAL_COMMUNITY): Payer: PRIVATE HEALTH INSURANCE

## 2021-06-20 ENCOUNTER — Inpatient Hospital Stay (HOSPITAL_COMMUNITY): Payer: PRIVATE HEALTH INSURANCE | Admitting: Anesthesiology

## 2021-06-20 ENCOUNTER — Other Ambulatory Visit (HOSPITAL_COMMUNITY): Payer: Self-pay | Admitting: Orthopedic Surgery

## 2021-06-20 ENCOUNTER — Ambulatory Visit
Admission: RE | Admit: 2021-06-20 | Discharge: 2021-06-20 | Disposition: A | Discharge status: Home / Self Care | Payer: Self-pay | Source: Ambulatory Visit | Attending: Orthopedic Surgery | Admitting: Orthopedic Surgery

## 2021-06-20 ENCOUNTER — Encounter (HOSPITAL_COMMUNITY): Payer: Self-pay | Admitting: Orthopedic Surgery

## 2021-06-20 ENCOUNTER — Inpatient Hospital Stay: Payer: Self-pay

## 2021-06-20 ENCOUNTER — Encounter (HOSPITAL_COMMUNITY)
Admission: RE | Disposition: A | Discharge status: Home / Self Care | Payer: Self-pay | Source: Home / Self Care | Attending: Orthopedic Surgery

## 2021-06-20 ENCOUNTER — Inpatient Hospital Stay (HOSPITAL_COMMUNITY)
Admission: RE | Admit: 2021-06-20 | Discharge: 2021-06-21 | DRG: 455 | Disposition: A | Discharge status: Home / Self Care | Payer: PRIVATE HEALTH INSURANCE | Attending: Orthopedic Surgery | Admitting: Orthopedic Surgery

## 2021-06-20 DIAGNOSIS — Z981 Arthrodesis status: Secondary | ICD-10-CM | POA: Diagnosis not present

## 2021-06-20 DIAGNOSIS — M5117 Intervertebral disc disorders with radiculopathy, lumbosacral region: Secondary | ICD-10-CM | POA: Diagnosis present

## 2021-06-20 DIAGNOSIS — I10 Essential (primary) hypertension: Secondary | ICD-10-CM | POA: Diagnosis present

## 2021-06-20 DIAGNOSIS — M79604 Pain in right leg: Secondary | ICD-10-CM | POA: Diagnosis present

## 2021-06-20 DIAGNOSIS — Z79899 Other long term (current) drug therapy: Secondary | ICD-10-CM | POA: Diagnosis not present

## 2021-06-20 DIAGNOSIS — F32A Depression, unspecified: Secondary | ICD-10-CM | POA: Diagnosis present

## 2021-06-20 DIAGNOSIS — M5116 Intervertebral disc disorders with radiculopathy, lumbar region: Principal | ICD-10-CM | POA: Diagnosis present

## 2021-06-20 DIAGNOSIS — Z419 Encounter for procedure for purposes other than remedying health state, unspecified: Secondary | ICD-10-CM

## 2021-06-20 DIAGNOSIS — M5416 Radiculopathy, lumbar region: Secondary | ICD-10-CM | POA: Diagnosis present

## 2021-06-20 HISTORY — PX: TRANSFORAMINAL LUMBAR INTERBODY FUSION (TLIF) WITH PEDICLE SCREW FIXATION 1 LEVEL: SHX6141

## 2021-06-20 LAB — ABO/RH: ABO/RH(D): B POS

## 2021-06-20 LAB — POCT PREGNANCY, URINE: Preg Test, Ur: NEGATIVE

## 2021-06-20 SURGERY — TRANSFORAMINAL LUMBAR INTERBODY FUSION (TLIF) WITH PEDICLE SCREW FIXATION 1 LEVEL
Anesthesia: General | Laterality: Right

## 2021-06-20 MED ORDER — HYDROMORPHONE HCL 1 MG/ML IJ SOLN
INTRAMUSCULAR | Status: AC
  Administered 2021-06-20: 0.25 mg via INTRAVENOUS
  Filled 2021-06-20: qty 1

## 2021-06-20 MED ORDER — ORAL CARE MOUTH RINSE: 15.0000 mL | Freq: Once | OROMUCOSAL | Status: AC

## 2021-06-20 MED ORDER — ONDANSETRON HCL 4 MG PO TABS: 4.0000 mg | ORAL_TABLET | Freq: Four times a day (QID) | ORAL | Status: DC | PRN

## 2021-06-20 MED ORDER — CEFAZOLIN SODIUM-DEXTROSE 2-4 GM/100ML-% IV SOLN
2.0000 g | INTRAVENOUS | Status: AC
  Administered 2021-06-20: 2 g via INTRAVENOUS
  Filled 2021-06-20: qty 100

## 2021-06-20 MED ORDER — LIDOCAINE 2% (20 MG/ML) 5 ML SYRINGE
INTRAMUSCULAR | Status: DC | PRN
  Administered 2021-06-20: 100 mg via INTRAVENOUS

## 2021-06-20 MED ORDER — FENTANYL CITRATE (PF) 250 MCG/5ML IJ SOLN
INTRAMUSCULAR | Status: AC
  Filled 2021-06-20: qty 5

## 2021-06-20 MED ORDER — ROCURONIUM BROMIDE 10 MG/ML (PF) SYRINGE
PREFILLED_SYRINGE | INTRAVENOUS | Status: AC
  Filled 2021-06-20: qty 10

## 2021-06-20 MED ORDER — SODIUM CHLORIDE 0.9% FLUSH: 3.0000 mL | Freq: Two times a day (BID) | INTRAVENOUS | Status: DC

## 2021-06-20 MED ORDER — FENTANYL CITRATE (PF) 250 MCG/5ML IJ SOLN
INTRAMUSCULAR | Status: DC | PRN
  Administered 2021-06-20 (×4): 50 ug via INTRAVENOUS

## 2021-06-20 MED ORDER — ACETAMINOPHEN 10 MG/ML IV SOLN
INTRAVENOUS | Status: AC
  Filled 2021-06-20: qty 100

## 2021-06-20 MED ORDER — FLEET ENEMA 7-19 GM/118ML RE ENEM: 1.0000 | ENEMA | Freq: Once | RECTAL | Status: DC | PRN

## 2021-06-20 MED ORDER — PHENYLEPHRINE HCL-NACL 20-0.9 MG/250ML-% IV SOLN
INTRAVENOUS | Status: AC
  Filled 2021-06-20: qty 250

## 2021-06-20 MED ORDER — METHOCARBAMOL 1000 MG/10ML IJ SOLN
500.0000 mg | Freq: Four times a day (QID) | INTRAVENOUS | Status: DC | PRN
  Filled 2021-06-20: qty 5

## 2021-06-20 MED ORDER — PROMETHAZINE HCL 25 MG/ML IJ SOLN: 6.2500 mg | INTRAMUSCULAR | Status: DC | PRN

## 2021-06-20 MED ORDER — PHENYLEPHRINE 40 MCG/ML (10ML) SYRINGE FOR IV PUSH (FOR BLOOD PRESSURE SUPPORT)
PREFILLED_SYRINGE | INTRAVENOUS | Status: DC | PRN
  Administered 2021-06-20: 80 ug via INTRAVENOUS
  Administered 2021-06-20: 40 ug via INTRAVENOUS

## 2021-06-20 MED ORDER — POVIDONE-IODINE 7.5 % EX SOLN: Freq: Once | CUTANEOUS | Status: DC

## 2021-06-20 MED ORDER — BUPIVACAINE-EPINEPHRINE 0.25% -1:200000 IJ SOLN
INTRAMUSCULAR | Status: DC | PRN
  Administered 2021-06-20: 10 mL

## 2021-06-20 MED ORDER — ACETAMINOPHEN 650 MG RE SUPP: 650.0000 mg | RECTAL | Status: DC | PRN

## 2021-06-20 MED ORDER — BUPROPION HCL ER (XL) 300 MG PO TB24
450.0000 mg | ORAL_TABLET | Freq: Every day | ORAL | Status: DC
  Administered 2021-06-21: 450 mg via ORAL
  Filled 2021-06-20: qty 1

## 2021-06-20 MED ORDER — PROPOFOL 10 MG/ML IV BOLUS
INTRAVENOUS | Status: AC
  Filled 2021-06-20: qty 20

## 2021-06-20 MED ORDER — THROMBIN 20000 UNITS EX SOLR
CUTANEOUS | Status: DC | PRN
  Administered 2021-06-20: 20 mL via TOPICAL

## 2021-06-20 MED ORDER — BUPIVACAINE-EPINEPHRINE (PF) 0.25% -1:200000 IJ SOLN
INTRAMUSCULAR | Status: AC
  Filled 2021-06-20: qty 30

## 2021-06-20 MED ORDER — DILTIAZEM HCL ER COATED BEADS 120 MG PO CP24
240.0000 mg | ORAL_CAPSULE | Freq: Every day | ORAL | Status: DC
  Administered 2021-06-21: 240 mg via ORAL
  Filled 2021-06-20: qty 2

## 2021-06-20 MED ORDER — SUGAMMADEX SODIUM 200 MG/2ML IV SOLN
INTRAVENOUS | Status: DC | PRN
  Administered 2021-06-20: 200 mg via INTRAVENOUS

## 2021-06-20 MED ORDER — ROCURONIUM BROMIDE 10 MG/ML (PF) SYRINGE
PREFILLED_SYRINGE | INTRAVENOUS | Status: DC | PRN
  Administered 2021-06-20: 30 mg via INTRAVENOUS
  Administered 2021-06-20: 20 mg via INTRAVENOUS
  Administered 2021-06-20: 60 mg via INTRAVENOUS
  Administered 2021-06-20 (×3): 30 mg via INTRAVENOUS

## 2021-06-20 MED ORDER — ONDANSETRON HCL 4 MG/2ML IJ SOLN: 4.0000 mg | Freq: Four times a day (QID) | INTRAMUSCULAR | Status: DC | PRN

## 2021-06-20 MED ORDER — DEXAMETHASONE SODIUM PHOSPHATE 10 MG/ML IJ SOLN
INTRAMUSCULAR | Status: AC
  Filled 2021-06-20: qty 1

## 2021-06-20 MED ORDER — BUPIVACAINE LIPOSOME 1.3 % IJ SUSP
INTRAMUSCULAR | Status: DC | PRN
  Administered 2021-06-20: 20 mL

## 2021-06-20 MED ORDER — PROPOFOL 1000 MG/100ML IV EMUL
INTRAVENOUS | Status: AC
  Filled 2021-06-20: qty 200

## 2021-06-20 MED ORDER — MENTHOL 3 MG MT LOZG: 1.0000 | LOZENGE | OROMUCOSAL | Status: DC | PRN

## 2021-06-20 MED ORDER — PHENYLEPHRINE HCL-NACL 20-0.9 MG/250ML-% IV SOLN
INTRAVENOUS | Status: DC | PRN
Start: 2021-06-20 — End: 2021-06-20
  Administered 2021-06-20: 15 ug/min via INTRAVENOUS

## 2021-06-20 MED ORDER — PHENOL 1.4 % MT LIQD: 1.0000 | OROMUCOSAL | Status: DC | PRN

## 2021-06-20 MED ORDER — SENNOSIDES-DOCUSATE SODIUM 8.6-50 MG PO TABS: 1.0000 | ORAL_TABLET | Freq: Every evening | ORAL | Status: DC | PRN

## 2021-06-20 MED ORDER — 0.9 % SODIUM CHLORIDE (POUR BTL) OPTIME
TOPICAL | Status: DC | PRN
  Administered 2021-06-20: 2000 mL

## 2021-06-20 MED ORDER — ACETAMINOPHEN 325 MG PO TABS: 650.0000 mg | ORAL_TABLET | ORAL | Status: DC | PRN

## 2021-06-20 MED ORDER — ZOLPIDEM TARTRATE 5 MG PO TABS: 5.0000 mg | ORAL_TABLET | Freq: Every evening | ORAL | Status: DC | PRN

## 2021-06-20 MED ORDER — DOCUSATE SODIUM 100 MG PO CAPS
100.0000 mg | ORAL_CAPSULE | Freq: Two times a day (BID) | ORAL | Status: DC
  Administered 2021-06-20 – 2021-06-21 (×2): 100 mg via ORAL
  Filled 2021-06-20 (×2): qty 1

## 2021-06-20 MED ORDER — METHYLENE BLUE 0.5 % INJ SOLN
INTRAVENOUS | Status: AC
  Filled 2021-06-20: qty 10

## 2021-06-20 MED ORDER — OXYCODONE-ACETAMINOPHEN 5-325 MG PO TABS
1.0000 | ORAL_TABLET | ORAL | Status: DC | PRN
Start: 2021-06-20 — End: 2021-06-21
  Administered 2021-06-20: 2 via ORAL
  Administered 2021-06-20: 1 via ORAL
  Administered 2021-06-21 (×3): 2 via ORAL
  Filled 2021-06-20: qty 1
  Filled 2021-06-20 (×4): qty 2

## 2021-06-20 MED ORDER — THROMBIN 20000 UNITS EX SOLR
CUTANEOUS | Status: AC
  Filled 2021-06-20: qty 20000

## 2021-06-20 MED ORDER — LACTATED RINGERS IV SOLN: INTRAVENOUS | Status: DC

## 2021-06-20 MED ORDER — MORPHINE SULFATE (PF) 2 MG/ML IV SOLN
1.0000 mg | INTRAVENOUS | Status: DC | PRN
  Administered 2021-06-20: 2 mg via INTRAVENOUS
  Filled 2021-06-20: qty 1

## 2021-06-20 MED ORDER — DEXAMETHASONE SODIUM PHOSPHATE 10 MG/ML IJ SOLN
INTRAMUSCULAR | Status: DC | PRN
  Administered 2021-06-20: 5 mg via INTRAVENOUS

## 2021-06-20 MED ORDER — POTASSIUM CHLORIDE IN NACL 20-0.9 MEQ/L-% IV SOLN: INTRAVENOUS | Status: DC

## 2021-06-20 MED ORDER — ACETAMINOPHEN 500 MG PO TABS: 1000.0000 mg | ORAL_TABLET | Freq: Once | ORAL | Status: DC

## 2021-06-20 MED ORDER — DEXMEDETOMIDINE (PRECEDEX) IN NS 20 MCG/5ML (4 MCG/ML) IV SYRINGE
PREFILLED_SYRINGE | INTRAVENOUS | Status: AC
  Filled 2021-06-20: qty 5

## 2021-06-20 MED ORDER — SODIUM CHLORIDE 0.9 % IV SOLN
250.0000 mL | INTRAVENOUS | Status: DC
  Administered 2021-06-20: 250 mL via INTRAVENOUS

## 2021-06-20 MED ORDER — PROPOFOL 10 MG/ML IV BOLUS
INTRAVENOUS | Status: DC | PRN
  Administered 2021-06-20: 140 mg via INTRAVENOUS
  Administered 2021-06-20: 40 mg via INTRAVENOUS

## 2021-06-20 MED ORDER — ALUM & MAG HYDROXIDE-SIMETH 200-200-20 MG/5ML PO SUSP: 30.0000 mL | Freq: Four times a day (QID) | ORAL | Status: DC | PRN

## 2021-06-20 MED ORDER — PHENYLEPHRINE 40 MCG/ML (10ML) SYRINGE FOR IV PUSH (FOR BLOOD PRESSURE SUPPORT)
PREFILLED_SYRINGE | INTRAVENOUS | Status: AC
  Filled 2021-06-20: qty 10

## 2021-06-20 MED ORDER — ONDANSETRON HCL 4 MG/2ML IJ SOLN
INTRAMUSCULAR | Status: AC
  Filled 2021-06-20: qty 2

## 2021-06-20 MED ORDER — SCOPOLAMINE 1 MG/3DAYS TD PT72
MEDICATED_PATCH | TRANSDERMAL | Status: AC
  Filled 2021-06-20: qty 1

## 2021-06-20 MED ORDER — DEXMEDETOMIDINE (PRECEDEX) IN NS 20 MCG/5ML (4 MCG/ML) IV SYRINGE
PREFILLED_SYRINGE | INTRAVENOUS | Status: DC | PRN
  Administered 2021-06-20: 4 ug via INTRAVENOUS
  Administered 2021-06-20: 8 ug via INTRAVENOUS

## 2021-06-20 MED ORDER — LIDOCAINE 2% (20 MG/ML) 5 ML SYRINGE
INTRAMUSCULAR | Status: AC
  Filled 2021-06-20: qty 5

## 2021-06-20 MED ORDER — MIDAZOLAM HCL 2 MG/2ML IJ SOLN
INTRAMUSCULAR | Status: AC
  Filled 2021-06-20: qty 2

## 2021-06-20 MED ORDER — SCOPOLAMINE 1 MG/3DAYS TD PT72
1.0000 | MEDICATED_PATCH | Freq: Once | TRANSDERMAL | Status: AC
  Administered 2021-06-20: 1 via TRANSDERMAL

## 2021-06-20 MED ORDER — BUPIVACAINE LIPOSOME 1.3 % IJ SUSP
INTRAMUSCULAR | Status: AC
  Filled 2021-06-20: qty 20

## 2021-06-20 MED ORDER — ONDANSETRON HCL 4 MG/2ML IJ SOLN
INTRAMUSCULAR | Status: DC | PRN
  Administered 2021-06-20: 4 mg via INTRAVENOUS

## 2021-06-20 MED ORDER — METHOCARBAMOL 500 MG PO TABS
500.0000 mg | ORAL_TABLET | Freq: Four times a day (QID) | ORAL | Status: DC | PRN
  Administered 2021-06-20 – 2021-06-21 (×3): 500 mg via ORAL
  Filled 2021-06-20 (×3): qty 1

## 2021-06-20 MED ORDER — CHLORHEXIDINE GLUCONATE 0.12 % MT SOLN
15.0000 mL | Freq: Once | OROMUCOSAL | Status: AC
  Administered 2021-06-20: 15 mL via OROMUCOSAL
  Filled 2021-06-20: qty 15

## 2021-06-20 MED ORDER — ACETAMINOPHEN 10 MG/ML IV SOLN
INTRAVENOUS | Status: DC | PRN
  Administered 2021-06-20: 1000 mg via INTRAVENOUS

## 2021-06-20 MED ORDER — PROPOFOL 500 MG/50ML IV EMUL
INTRAVENOUS | Status: DC | PRN
  Administered 2021-06-20: 150 ug/kg/min via INTRAVENOUS

## 2021-06-20 MED ORDER — CEFAZOLIN SODIUM-DEXTROSE 2-4 GM/100ML-% IV SOLN
2.0000 g | Freq: Three times a day (TID) | INTRAVENOUS | Status: AC
Start: 2021-06-20 — End: 2021-06-21
  Administered 2021-06-20 – 2021-06-21 (×2): 2 g via INTRAVENOUS
  Filled 2021-06-20 (×2): qty 100

## 2021-06-20 MED ORDER — SODIUM CHLORIDE 0.9% FLUSH: 3.0000 mL | INTRAVENOUS | Status: DC | PRN

## 2021-06-20 MED ORDER — BISACODYL 5 MG PO TBEC: 5.0000 mg | DELAYED_RELEASE_TABLET | Freq: Every day | ORAL | Status: DC | PRN

## 2021-06-20 MED ORDER — HYDROMORPHONE HCL 1 MG/ML IJ SOLN
0.2500 mg | INTRAMUSCULAR | Status: DC | PRN
  Administered 2021-06-20: 0.5 mg via INTRAVENOUS
  Administered 2021-06-20: 0.25 mg via INTRAVENOUS
  Administered 2021-06-20: 0.5 mg via INTRAVENOUS
  Administered 2021-06-20: 0.25 mg via INTRAVENOUS

## 2021-06-20 MED ORDER — MIDAZOLAM HCL 2 MG/2ML IJ SOLN
INTRAMUSCULAR | Status: DC | PRN
  Administered 2021-06-20: 2 mg via INTRAVENOUS

## 2021-06-20 SURGICAL SUPPLY — 89 items
AGENT HMST KT MTR STRL THRMB (HEMOSTASIS)
APL SKNCLS STERI-STRIP NONHPOA (GAUZE/BANDAGES/DRESSINGS) ×1
BAG COUNTER SPONGE SURGICOUNT (BAG) ×2 IMPLANT
BAG SPNG CNTER NS LX DISP (BAG) ×1
BENZOIN TINCTURE PRP APPL 2/3 (GAUZE/BANDAGES/DRESSINGS) ×2 IMPLANT
BLADE CLIPPER SURG (BLADE) IMPLANT
BONE VIVIGEN FORMABLE 10CC (Bone Implant) ×2 IMPLANT
BUR PRESCISION 1.7 ELITE (BURR) ×2 IMPLANT
BUR ROUND FLUTED 5 RND (BURR) ×2 IMPLANT
BUR ROUND PRECISION 4.0 (BURR) IMPLANT
BUR SABER RD CUTTING 3.0 (BURR) IMPLANT
CAGE SABLE 10X26 6-12 8D (Cage) ×2 IMPLANT
CARTRIDGE OIL MAESTRO DRILL (MISCELLANEOUS) ×1 IMPLANT
CLSR STERI-STRIP ANTIMIC 1/2X4 (GAUZE/BANDAGES/DRESSINGS) ×1 IMPLANT
CNTNR URN SCR LID CUP LEK RST (MISCELLANEOUS) ×1 IMPLANT
CONT SPEC 4OZ STRL OR WHT (MISCELLANEOUS) ×2
COVER BACK TABLE 60X90IN (DRAPES) ×2 IMPLANT
COVER MAYO STAND STRL (DRAPES) ×3 IMPLANT
COVER SURGICAL LIGHT HANDLE (MISCELLANEOUS) ×1 IMPLANT
DIFFUSER DRILL AIR PNEUMATIC (MISCELLANEOUS) ×2 IMPLANT
DRAIN CHANNEL 15F RND FF W/TCR (WOUND CARE) IMPLANT
DRAPE C-ARM 42X72 X-RAY (DRAPES) ×2 IMPLANT
DRAPE C-ARMOR (DRAPES) ×2 IMPLANT
DRAPE POUCH INSTRU U-SHP 10X18 (DRAPES) ×2 IMPLANT
DRAPE SURG 17X23 STRL (DRAPES) ×8 IMPLANT
DURAPREP 26ML APPLICATOR (WOUND CARE) ×2 IMPLANT
ELECT BLADE 4.0 EZ CLEAN MEGAD (MISCELLANEOUS) ×2
ELECT CAUTERY BLADE 6.4 (BLADE) ×2 IMPLANT
ELECT REM PT RETURN 9FT ADLT (ELECTROSURGICAL) ×2
ELECTRODE BLDE 4.0 EZ CLN MEGD (MISCELLANEOUS) ×1 IMPLANT
ELECTRODE REM PT RTRN 9FT ADLT (ELECTROSURGICAL) ×1 IMPLANT
EVACUATOR SILICONE 100CC (DRAIN) IMPLANT
FILTER STRAW FLUID ASPIR (MISCELLANEOUS) ×1 IMPLANT
GAUZE 4X4 16PLY ~~LOC~~+RFID DBL (SPONGE) ×2 IMPLANT
GAUZE SPONGE 4X4 12PLY STRL (GAUZE/BANDAGES/DRESSINGS) ×2 IMPLANT
GLOVE SRG 8 PF TXTR STRL LF DI (GLOVE) ×1 IMPLANT
GLOVE SURG ENC MOIS LTX SZ6.5 (GLOVE) ×2 IMPLANT
GLOVE SURG ENC MOIS LTX SZ8 (GLOVE) ×2 IMPLANT
GLOVE SURG UNDER POLY LF SZ7 (GLOVE) ×3 IMPLANT
GLOVE SURG UNDER POLY LF SZ8 (GLOVE) ×2
GOWN STRL REUS W/ TWL LRG LVL3 (GOWN DISPOSABLE) ×2 IMPLANT
GOWN STRL REUS W/ TWL XL LVL3 (GOWN DISPOSABLE) ×2 IMPLANT
GOWN STRL REUS W/TWL LRG LVL3 (GOWN DISPOSABLE) ×4
GOWN STRL REUS W/TWL XL LVL3 (GOWN DISPOSABLE) ×4
GRAFT BNE MATRIX VG FRMBL L 10 (Bone Implant) IMPLANT
IV CATH 14GX2 1/4 (CATHETERS) ×2 IMPLANT
KIT BASIN OR (CUSTOM PROCEDURE TRAY) ×2 IMPLANT
KIT POSITION SURG JACKSON T1 (MISCELLANEOUS) ×2 IMPLANT
KIT TURNOVER KIT B (KITS) ×2 IMPLANT
MARKER SKIN DUAL TIP RULER LAB (MISCELLANEOUS) ×4 IMPLANT
NDL 18GX1X1/2 (RX/OR ONLY) (NEEDLE) ×1 IMPLANT
NDL HYPO 25GX1X1/2 BEV (NEEDLE) ×1 IMPLANT
NDL SPNL 18GX3.5 QUINCKE PK (NEEDLE) ×2 IMPLANT
NEEDLE 18GX1X1/2 (RX/OR ONLY) (NEEDLE) ×2 IMPLANT
NEEDLE 22X1 1/2 (OR ONLY) (NEEDLE) ×4 IMPLANT
NEEDLE HYPO 25GX1X1/2 BEV (NEEDLE) ×2 IMPLANT
NEEDLE SPNL 18GX3.5 QUINCKE PK (NEEDLE) ×4 IMPLANT
NS IRRIG 1000ML POUR BTL (IV SOLUTION) ×3 IMPLANT
OIL CARTRIDGE MAESTRO DRILL (MISCELLANEOUS) ×2
PACK LAMINECTOMY ORTHO (CUSTOM PROCEDURE TRAY) ×2 IMPLANT
PACK UNIVERSAL I (CUSTOM PROCEDURE TRAY) ×2 IMPLANT
PAD ARMBOARD 7.5X6 YLW CONV (MISCELLANEOUS) ×2 IMPLANT
PATTIES SURGICAL .5 X1 (DISPOSABLE) ×2 IMPLANT
PATTIES SURGICAL .5X1.5 (GAUZE/BANDAGES/DRESSINGS) ×2 IMPLANT
ROD PRE BENT EXP 40MM (Rod) ×4 IMPLANT
SCREW SET SINGLE INNER (Screw) ×4 IMPLANT
SCREW VIPER CORT FIX 6.00X30 (Screw) ×2 IMPLANT
SCREW VIPER CORT FIX 6X35 (Screw) ×2 IMPLANT
SPONGE INTESTINAL PEANUT (DISPOSABLE) ×2 IMPLANT
SPONGE SURGIFOAM ABS GEL 100 (HEMOSTASIS) ×2 IMPLANT
STRIP CLOSURE SKIN 1/2X4 (GAUZE/BANDAGES/DRESSINGS) ×4 IMPLANT
SURGIFLO W/THROMBIN 8M KIT (HEMOSTASIS) IMPLANT
SUT MNCRL AB 4-0 PS2 18 (SUTURE) ×2 IMPLANT
SUT VIC AB 0 CT1 18XCR BRD 8 (SUTURE) ×1 IMPLANT
SUT VIC AB 0 CT1 8-18 (SUTURE) ×4
SUT VIC AB 1 CT1 18XCR BRD 8 (SUTURE) ×1 IMPLANT
SUT VIC AB 1 CT1 8-18 (SUTURE) ×4
SUT VIC AB 2-0 CT2 18 VCP726D (SUTURE) ×2 IMPLANT
SYR 20ML LL LF (SYRINGE) ×4 IMPLANT
SYR 30ML LL (SYRINGE) ×1 IMPLANT
SYR BULB IRRIG 60ML STRL (SYRINGE) ×2 IMPLANT
SYR CONTROL 10ML LL (SYRINGE) ×4 IMPLANT
SYR TB 1ML LUER SLIP (SYRINGE) ×2 IMPLANT
TAP EXPEDIUM DL 4.35 (INSTRUMENTS) ×1 IMPLANT
TAP EXPEDIUM DL 5.0 (INSTRUMENTS) ×1 IMPLANT
TAP EXPEDIUM DL 6.0 (INSTRUMENTS) ×1 IMPLANT
TRAY FOLEY MTR SLVR 16FR STAT (SET/KITS/TRAYS/PACK) ×2 IMPLANT
WATER STERILE IRR 1000ML POUR (IV SOLUTION) ×2 IMPLANT
YANKAUER SUCT BULB TIP NO VENT (SUCTIONS) ×2 IMPLANT

## 2021-06-20 NOTE — Anesthesia Postprocedure Evaluation (Signed)
Anesthesia Post Note  Patient: LASHEENA FRIEZE  Procedure(s) Performed: RIGHT-SIDED LUMBAR five - SACRUM one TRANSFORAMINAL LUMBAR INTERBODY FUSION WITH INSTRUMENTATION AND ALLOGRAFT (Right)     Patient location during evaluation: PACU Anesthesia Type: General Level of consciousness: awake and alert, patient cooperative and oriented Pain management: pain level controlled Vital Signs Assessment: post-procedure vital signs reviewed and stable Respiratory status: spontaneous breathing, nonlabored ventilation and respiratory function stable Cardiovascular status: blood pressure returned to baseline and stable Postop Assessment: no apparent nausea or vomiting Anesthetic complications: no   No notable events documented.  Last Vitals:  Vitals:   06/20/21 1641 06/20/21 1656  BP: 116/77 118/77  Pulse: 62 65  Resp: 11 10  Temp:    SpO2: 100% 100%    Last Pain:  Vitals:   06/20/21 1656  TempSrc:   PainSc: Asleep                 Jasan Doughtie,E. Markis Langland

## 2021-06-20 NOTE — Anesthesia Preprocedure Evaluation (Addendum)
Anesthesia Evaluation  Patient identified by MRN, date of birth, ID band Patient awake    Reviewed: Allergy & Precautions, NPO status , Patient's Chart, lab work & pertinent test results  History of Anesthesia Complications (+) PONV and history of anesthetic complications  Airway Mallampati: II  TM Distance: >3 FB Neck ROM: Full    Dental  (+) Missing,    Pulmonary neg pulmonary ROS,    Pulmonary exam normal        Cardiovascular hypertension, Pt. on medications Normal cardiovascular exam     Neuro/Psych S/P ACDF 2013    GI/Hepatic negative GI ROS, Neg liver ROS,   Endo/Other  negative endocrine ROS  Renal/GU negative Renal ROS  negative genitourinary   Musculoskeletal  (+) Arthritis ,   Abdominal   Peds  Hematology negative hematology ROS (+)   Anesthesia Other Findings Day of surgery medications reviewed with patient.  Reproductive/Obstetrics negative OB ROS                            Anesthesia Physical Anesthesia Plan  ASA: 2  Anesthesia Plan: General   Post-op Pain Management:    Induction: Intravenous  PONV Risk Score and Plan: 4 or greater and Treatment may vary due to age or medical condition, Ondansetron, Dexamethasone, Midazolam, Scopolamine patch - Pre-op and Propofol infusion  Airway Management Planned: Oral ETT  Additional Equipment:   Intra-op Plan:   Post-operative Plan: Extubation in OR  Informed Consent: I have reviewed the patients History and Physical, chart, labs and discussed the procedure including the risks, benefits and alternatives for the proposed anesthesia with the patient or authorized representative who has indicated his/her understanding and acceptance.     Dental advisory given  Plan Discussed with: CRNA  Anesthesia Plan Comments:        Anesthesia Quick Evaluation

## 2021-06-20 NOTE — Op Note (Signed)
PATIENT NAME: Suzanne Santos   MEDICAL RECORD NO.:   [id number]   DATE OF BIRTH: Mar 26, 1981    DATE OF PROCEDURE: 06/20/2021                                OPERATIVE REPORT   PREOPERATIVE DIAGNOSES: 1. Recurrent right-sided S1 radiculopathy 2. Severe L5-S1 degenerative disc disease 3. Status post previous right L5-S1 microdiscectomy    POSTOPERATIVE DIAGNOSES: 1. Recurrent right-sided S1 radiculopathy 2. Severe L5-S1 degenerative disc disease 3. Status post previous right L5-S1 microdiscectomy    PROCEDURES: 1. Left-sided L5-S1 transforaminal lumbar interbody fusion 2. Complex L5-S1 decompression, including removal of abundant scar tissue circumferentially about the traversing right S1 nerve 3. Right-sided L5-S1 posterolateral fusion. 4. Insertion of interbody device x1 (Globus expandable intervertebral spacer). 5. Placement of posterior instrumentation at L5, S1 bilaterally. 6. Use of local autograft. 7. Use of morselized allograft - ViviGen. 8. Intraoperative use of fluoroscopy.   SURGEON:  Estill Bamberg, MD.   ASSISTANT:  Skip Mayer, PA-C   ANESTHESIA:  General endotracheal anesthesia.   COMPLICATIONS:  None.   DISPOSITION:  Stable.   ESTIMATED BLOOD LOSS:   Minimal   INDICATIONS FOR SURGERY:  Briefly, Suzanne Santos is a pleasant 40 year old female who did present to me with severe and ongoing pain in the right leg.  She is status post a previous decompression, but did have recurrence of right leg pain.  An updated MRI did reveal a disc protrusion, contacting the traversing right S1 nerve, which I did feel was the finding responsible for the patient's pain. We did have an extensive discussion about surgery, and she did elect to proceed with the procedure noted above.    Of note, my preoperative plan was to perform the discectomy from the right side.  However, upon exploration of the right lateral recess, it was noted that the dura on the right, in the region of the  patient's previous decompression, was very attenuated.  I therefore did elect to perform the discectomy and decompression from the contralateral left side, given the concern for durotomy and/or neurologic injury.    OPERATIVE DETAILS:  On 06/20/2021 , the patient was brought to surgery and general endotracheal anesthesia was administered.  The patient was placed prone on a well-padded flat Jackson bed with a spinal frame.  Antibiotics were given and a time-out procedure was performed. The back was prepped and draped in the usual fashion.  A midline incision was made overlying the L5-S1 intervertebral space.  The fascia was incised at the midline.  The paraspinal musculature was bluntly swept laterally.  At this point, I did subperiosteally expose the L5 lamina and did identify the previous laminotomy defect at the L5-S1 region.  I did use a curette to remove scar tissue about the region of the laminotomy.  The dura on the right came into visualization, and it was notable that the dura was quite attenuated, from the patient's previous decompression.  There is no extravasation of cerebrospinal fluid at any point, however, I did feel that manipulation of the attenuated dura would bring with a high risk of durotomy and potentially, neurologic injury.  I therefore did elect to expose the left lateral recess and to perform the decompression and discectomy from the contralateral left side.  At this point, anatomic landmarks for the pedicles were exposed. Using fluoroscopy, I did cannulate the L5 and S1 pedicles bilaterally, using a medial to  lateral cortical trajectory technique.  On the right side, the posterolateral gutter and right facet joint at L5/S1 was decorticated and 6 x 30 mm screws were placed and a 40-mm rod was placed and distraction was applied across the rod on the right side.  On the left side, the cannulated pedicle holes were filled with bone wax.  I then proceeded with the decompressive  aspect of the procedure.  On the left side, I did perform a partial facetectomy, and remove the lateral aspect of the ligamentum flavum across the L5-S1 segment.  The traversing left S1 nerve was noted and was medially retracted.  With gentle medial retraction of the nerve, I did proceed with the discectomy in the standard fashion.  Of note, as the disc protrusion did extend to the right side, I did ensure appropriate medialization of the left S1 nerve, and I did liberally use reverse angled Epstein curettes to work beneath the annulus.  In doing so, I was able to remove all protruding disc fragments on both the left and right sides.  Multiple fragments of disc were removed, and I did feel that the right and left lateral recess was t appropriately decompressed. I then used a series of curettes and pituitary rongeurs to perform a thorough and complete intervertebral diskectomy.  The intervertebral space was then liberally packed with autograft as well as allograft in the form of ViviGen, as was the appropriate-sized intervertebral spacer.  The spacer was then tamped into position in the usual fashion, and was expanded to approximately 10 mm in height. I was very pleased with the press-fit of the spacer.  I then placed 6 x 30 mm screws on the left at L5 and S1.  A 40-mm rod was then placed and caps were placed. The distraction was then released on the contralateral right side.  All 4 caps were then locked.  The wound was copiously irrigated with a total of approximately 3 L prior to placing the bone graft.  Additional autograft and allograft were then packed into the posterolateral gutter on the right side to help aid in the L5-S1 fusion.  The wound was explored for any undue bleeding and there was no substantial bleeding encountered.  Gel-Foam was placed over the laminectomy site.  The wound was then closed in layers using #1 Vicryl followed by 2-0 Vicryl, followed by 4-0 Monocryl.  Benzoin and  Steri-Strips were applied followed by sterile dressing.    Of note, Skip Mayer, PA-C was my assistant throughout surgery, and did aid in retraction, placement of the hardware, suctioning, and closure.        Estill Bamberg, MD

## 2021-06-20 NOTE — Anesthesia Procedure Notes (Signed)
Procedure Name: Intubation Date/Time: 06/20/2021 12:33 PM Performed by: Rosine Door, RN Pre-anesthesia Checklist: Patient identified, Emergency Drugs available, Suction available and Patient being monitored Patient Re-evaluated:Patient Re-evaluated prior to induction Oxygen Delivery Method: Circle system utilized Preoxygenation: Pre-oxygenation with 100% oxygen Induction Type: IV induction Ventilation: Mask ventilation without difficulty Laryngoscope Size: Mac and 3 Grade View: Grade I Tube type: Oral Tube size: 7.0 mm Number of attempts: 1 Airway Equipment and Method: Stylet Placement Confirmation: ETT inserted through vocal cords under direct vision, positive ETCO2 and breath sounds checked- equal and bilateral Secured at: 21 cm Tube secured with: Tape Dental Injury: Teeth and Oropharynx as per pre-operative assessment  Comments: Atraumatic placement

## 2021-06-20 NOTE — Progress Notes (Signed)
Orthopedic Tech Progress Note Patient Details:  Suzanne Santos 12/03/1980 [id number]  Patient has brace  Patient ID: [name], female   DOB: Apr 06, 1981, 40 y.o.   MRN: [id number]  Donald Pore 06/20/2021, 4:38 PM

## 2021-06-20 NOTE — Transfer of Care (Signed)
Immediate Anesthesia Transfer of Care Note  Patient: Suzanne Santos  Procedure(s) Performed: RIGHT-SIDED LUMBAR five - SACRUM one TRANSFORAMINAL LUMBAR INTERBODY FUSION WITH INSTRUMENTATION AND ALLOGRAFT (Right)  Patient Location: PACU  Anesthesia Type:General  Level of Consciousness: drowsy  Airway & Oxygen Therapy: Patient Spontanous Breathing and Patient connected to face mask oxygen  Post-op Assessment: Report given to RN and Post -op Vital signs reviewed and stable  Post vital signs: Reviewed and stable  Last Vitals:  Vitals Value Taken Time  BP 129/82 06/20/21 1556  Temp    Pulse 67 06/20/21 1604  Resp 10 06/20/21 1604  SpO2 100 % 06/20/21 1604  Vitals shown include unvalidated device data.  Last Pain:  Vitals:   06/20/21 1018  TempSrc:   PainSc: 4       Patients Stated Pain Goal: 3 (06/20/21 1018)  Complications: No notable events documented.

## 2021-06-20 NOTE — H&P (Signed)
PREOPERATIVE H&P  Chief Complaint: Right leg pain  HPI: Suzanne Santos is a 40 y.o. female who presents with ongoing pain in the right leg  MRI reveals severe L5/S1 DDD and right S1 nerve root compression and well as a laminotomy ont he right at L5/S1  Patient has failed multiple forms of conservative care and continues to have pain (see office notes for additional details regarding the patient's full course of treatment)  Past Medical History:  Diagnosis Date   Complication of anesthesia    Depression    PREVENTITIVE   HSV (herpes simplex virus) infection    treated with Valtrex   Hypertension    Infertility, female    conceived on [place]   PONV (postoperative nausea and vomiting)    Past Surgical History:  Procedure Laterality Date   ANTERIOR CERVICAL DECOMP/DISCECTOMY FUSION  11/08/2011   Procedure: ANTERIOR CERVICAL DECOMPRESSION/DISCECTOMY FUSION 1 LEVEL;  Surgeon: Karn Cassis, MD;  Location: [facility];  Service: Neurosurgery;  Laterality: N/A;  Cervical Five-Six Anterior Cervical decompression/diskectomy fusion   CHOLECYSTECTOMY     SHOULDER ARTHROSCOPY WITH OPEN ROTATOR CUFF REPAIR Right 07/15/2013   Procedure: RIGHT SHOULDER ARTHROSCOPY WITH SUBACROMIAL DECOMPRESSION, labral and cuff debridement;  Surgeon: Javier Docker, MD;  Location: [facility];  Service: Orthopedics;  Laterality: Right;   Social History   Socioeconomic History   Marital status: Married    Spouse name: Not on file   Number of children: Not on file   Years of education: Not on file   Highest education level: Not on file  Occupational History   Not on file  Tobacco Use   Smoking status: Never   Smokeless tobacco: Never  Vaping Use   Vaping Use: Never used  Substance and Sexual Activity   Alcohol use: No    Comment: occassionally   Drug use: No   Sexual activity: Yes    Birth control/protection: None  Other Topics Concern   Not on file  Social History Narrative   Not on  file   Social Determinants of Health   Financial Resource Strain: Not on file  Food Insecurity: Not on file  Transportation Needs: Not on file  Physical Activity: Not on file  Stress: Not on file  Social Connections: Not on file   No family history on file. Allergies  Allergen Reactions   Cranberry Hives    Pt says she takes the tablets   Phenazopyridine Rash    OTC Azo   Prior to Admission medications   Medication Sig Start Date End Date Taking? Authorizing Provider  acetaminophen (TYLENOL) 500 MG tablet Take 1,000 mg by mouth every 4 (four) hours as needed for mild pain.   Yes [provider]  buPROPion (WELLBUTRIN XL) 150 MG 24 hr tablet Take 450 mg by mouth daily. 05/10/21  Yes [provider]  CARTIA XT 240 MG 24 hr capsule Take 240 mg by mouth daily. 03/29/21  Yes [provider]  HYDROcodone-acetaminophen (NORCO/VICODIN) 5-325 MG per tablet Take 1 tablet by mouth every 6 (six) hours as needed for pain.   Yes [provider]  ibuprofen (ADVIL,MOTRIN) 800 MG tablet Take 800 mg by mouth every 8 (eight) hours as needed for pain.   Yes [provider]     All other systems have been reviewed and were otherwise negative with the exception of those mentioned in the HPI and as above.  Physical Exam: There were no vitals filed for  this visit.  There is no height or weight on file to calculate BMI.  General: Alert, no acute distress Cardiovascular: No pedal edema Respiratory: No cyanosis, no use of accessory musculature Skin: No lesions in the area of chief complaint Neurologic: Sensation intact distally Psychiatric: Patient is competent for consent with normal mood and affect Lymphatic: No axillary or cervical lymphadenopathy   Assessment/Plan: RECURRENT LUMBAR RADICULOPATHY Plan for Procedure(s): RIGHT-SIDED LUMBAR 5 - SACRUM 1 TRANSFORAMINAL LUMBAR INTERBODY FUSION WITH INSTRUMENTATION AND ALLOGRAFT   Jackelyn Hoehn,  MD 06/20/2021 7:20 AM

## 2021-06-21 MED ORDER — METHOCARBAMOL 500 MG PO TABS: 500.0000 mg | ORAL_TABLET | Freq: Four times a day (QID) | ORAL | 2 refills | Status: AC | PRN

## 2021-06-21 MED ORDER — OXYCODONE-ACETAMINOPHEN 5-325 MG PO TABS: 1.0000 | ORAL_TABLET | ORAL | 0 refills | Status: AC | PRN

## 2021-06-21 NOTE — Progress Notes (Signed)
Pt. discharged home accompanied by husband. Prescriptions and discharge instructions given with verbalization of understanding. Incision site on back with no s/s of infection - no swelling, redness, bleeding, and/or drainage noted. Opportunity given to ask questions but no question asked. Pt. transported out of this unit in wheelchair by the volunteer   

## 2021-06-21 NOTE — Evaluation (Signed)
Occupational Therapy Evaluation Patient Details Name: Suzanne Santos MRN: [id number] DOB: November 13, 1980 Today's Date: 06/21/2021   History of Present Illness 40 y.o. female presents to [facility] hospital on 06/20/2021 with ongoing RLE radiating pain. MRI reveals L5-S1 DDD with R S1 nerve root compression. Pt underwent L L5-S1 TLIF and PLIF with nerve root decompression on 06/20/2021. PMH includes depression, HTN.   Clinical Impression   Pt admitted due to procedure listed above. PTA pt reported independence in all ADL's and IADL's, including working as a Charity fundraiser at [employer]. At this time, pt was educated on her back precautions and compensatory strategies for ADL's. She demonstrated no difficulties completing ADL's or functional mobility. She has no further OT needs and acute OT will sign off.       Recommendations for follow up therapy are one component of a multi-disciplinary discharge planning process, led by the attending physician.  Recommendations may be updated based on patient status, additional functional criteria and insurance authorization.   Follow Up Recommendations  No OT follow up    Equipment Recommendations  None recommended by OT    Recommendations for Other Services       Precautions / Restrictions Precautions Precautions: Back Precaution Booklet Issued: Yes (comment) Required Braces or Orthoses: Spinal Brace Spinal Brace: Thoracolumbosacral orthotic;Applied in sitting position Restrictions Weight Bearing Restrictions: No      Mobility Bed Mobility Overal bed mobility: Modified Independent             General bed mobility comments: increased time for sidelying to sitting, pt received rolled onto right side    Transfers Overall transfer level: Independent                    Balance Overall balance assessment: Independent                                         ADL either performed or assessed with clinical judgement   ADL Overall  ADL's : Modified independent                                       General ADL Comments: Pt is able to complete all ADL's with increased time and compensatory strategies.     Vision Baseline Vision/History: 0 No visual deficits Ability to See in Adequate Light: 0 Adequate Patient Visual Report: No change from baseline Vision Assessment?: No apparent visual deficits     Perception Perception Perception Tested?: No   Praxis Praxis Praxis tested?: Within functional limits    Pertinent Vitals/Pain Pain Assessment: 0-10 Pain Score: 4  Pain Location: low back Pain Descriptors / Indicators: Sore Pain Intervention(s): Monitored during session;Repositioned     Hand Dominance Right   Extremity/Trunk Assessment Upper Extremity Assessment Upper Extremity Assessment: Overall WFL for tasks assessed   Lower Extremity Assessment Lower Extremity Assessment: Overall WFL for tasks assessed   Cervical / Trunk Assessment Cervical / Trunk Assessment: Other exceptions Cervical / Trunk Exceptions: s/p spinal surgery, TLSO donned   Communication Communication Communication: No difficulties   Cognition Arousal/Alertness: Awake/alert Behavior During Therapy: WFL for tasks assessed/performed Overall Cognitive Status: Within Functional Limits for tasks assessed  General Comments  VSS on RA    Exercises     Shoulder Instructions      Home Living Family/patient expects to be discharged to:: Private residence Living Arrangements: Spouse/significant other;Children Available Help at Discharge: Family;Available PRN/intermittently Type of Home: House Home Access: Stairs to enter [employer] of Steps: 5 Entrance Stairs-Rails: Right;Left Home Layout: One level     Bathroom Shower/Tub: Chief Strategy Officer: Standard     Home Equipment: None          Prior Functioning/Environment Level of  Independence: Independent                 OT Problem List: Decreased strength;Decreased activity tolerance;Impaired balance (sitting and/or standing);Pain      OT Treatment/Interventions:      OT Goals(Current goals can be found in the care plan section) Acute Rehab OT Goals Patient Stated Goal: To go home OT Goal Formulation: All assessment and education complete, DC therapy Time For Goal Achievement: 06/21/21 Potential to Achieve Goals: Good  OT Frequency:     Barriers to D/C:            Co-evaluation              AM-PAC OT "6 Clicks" Daily Activity     Outcome Measure Help from another person eating meals?: None Help from another person taking care of personal grooming?: None Help from another person toileting, which includes using toliet, bedpan, or urinal?: None Help from another person bathing (including washing, rinsing, drying)?: None Help from another person to put on and taking off regular upper body clothing?: None Help from another person to put on and taking off regular lower body clothing?: None 6 Click Score: 24   End of Session Equipment Utilized During Treatment: Back brace Nurse Communication: Mobility status  Activity Tolerance: Patient tolerated treatment well Patient left: in bed;with call bell/phone within reach  OT Visit Diagnosis: Muscle weakness (generalized) (M62.81);Other abnormalities of gait and mobility (R26.89)                Time: 0076-2263 OT Time Calculation (min): 19 min Charges:  OT General Charges $OT Visit: 1 Visit OT Evaluation $OT Eval Low Complexity: 1 Low  Suzanne Chenault H., OTR/L Acute Rehabilitation  Suzanne Santos 06/21/2021, 10:31 AM

## 2021-06-21 NOTE — Addendum Note (Signed)
Addendum  created 06/21/21 1509 by Shelton Silvas, MD   Attestation recorded in Intraprocedure, Intraprocedure Attestations filed

## 2021-06-21 NOTE — Evaluation (Signed)
Physical Therapy Evaluation Patient Details Name: Suzanne Santos MRN: [id number] DOB: Jan 22, 1981 Today's Date: 06/21/2021  History of Present Illness  40 y.o. female presents to [facility] hospital on 06/20/2021 with ongoing RLE radiating pain. MRI reveals L5-S1 DDD with R S1 nerve root compression. Pt underwent L L5-S1 TLIF and PLIF with nerve root decompression on 06/20/2021. PMH includes depression, HTN.  Clinical Impression  Pt presents to PT s/p spinal decompression and fusion. Pt is currently able to mobilize at a modI level, requiring increased time due to low back soreness. Pt is able to don brace independently and expresses understanding of back precautions. Pt requires no further acute PT services at this time. Pt is encouraged to mobilize frequently to improve activity tolerance and aide in a return to her prior level of function. Acute PT signing off.       Recommendations for follow up therapy are one component of a multi-disciplinary discharge planning process, led by the attending physician.  Recommendations may be updated based on patient status, additional functional criteria and insurance authorization.  Follow Up Recommendations No PT follow up    Equipment Recommendations  None recommended by PT    Recommendations for Other Services       Precautions / Restrictions Precautions Precautions: Back Precaution Booklet Issued: Yes (comment) Required Braces or Orthoses: Spinal Brace Spinal Brace: Thoracolumbosacral orthotic;Applied in sitting position Restrictions Weight Bearing Restrictions: No      Mobility  Bed Mobility Overal bed mobility: Modified Independent             General bed mobility comments: increased time for sidelying to sitting, pt received rolled onto right side    Transfers Overall transfer level: Independent                  Ambulation/Gait Ambulation/Gait assistance: Modified independent (Device/Increase time) Gait Distance (Feet):  300 Feet Assistive device: None Gait Pattern/deviations: Step-through pattern Gait velocity: functional Gait velocity interpretation: >2.62 ft/sec, indicative of community ambulatory General Gait Details: pt with slowed step-through gait  Stairs Stairs: Yes Stairs assistance: Modified independent (Device/Increase time) Stair Management: One rail Left;Alternating pattern Number of Stairs: 5    Wheelchair Mobility    Modified Rankin (Stroke Patients Only)       Balance Overall balance assessment: Independent                                           Pertinent Vitals/Pain Pain Assessment: 0-10 Pain Score: 4  Pain Location: low back Pain Descriptors / Indicators: Sore Pain Intervention(s): Monitored during session    Home Living Family/patient expects to be discharged to:: Private residence Living Arrangements: Spouse/significant other;Children Available Help at Discharge: Family;Available PRN/intermittently Type of Home: House Home Access: Stairs to enter Entrance Stairs-Rails: Doctor, general practice of Steps: 5 Home Layout: One level Home Equipment: None      Prior Function Level of Independence: Independent               Hand Dominance        Extremity/Trunk Assessment   Upper Extremity Assessment Upper Extremity Assessment: Overall WFL for tasks assessed    Lower Extremity Assessment Lower Extremity Assessment: Overall WFL for tasks assessed    Cervical / Trunk Assessment Cervical / Trunk Assessment: Other exceptions Cervical / Trunk Exceptions: s/p spinal surgery, TLSO donned  Communication   Communication: No difficulties  Cognition Arousal/Alertness:  Awake/alert Behavior During Therapy: WFL for tasks assessed/performed Overall Cognitive Status: Within Functional Limits for tasks assessed                                        General Comments General comments (skin integrity, edema, etc.):  VSS on RA    Exercises     Assessment/Plan    PT Assessment Patent does not need any further PT services  PT Problem List         PT Treatment Interventions      PT Goals (Current goals can be found in the Care Plan section)       Frequency     Barriers to discharge        Co-evaluation               AM-PAC PT "6 Clicks" Mobility  Outcome Measure Help needed turning from your back to your side while in a flat bed without using bedrails?: None Help needed moving from lying on your back to sitting on the side of a flat bed without using bedrails?: None Help needed moving to and from a bed to a chair (including a wheelchair)?: None Help needed standing up from a chair using your arms (e.g., wheelchair or bedside chair)?: None Help needed to walk in hospital room?: None Help needed climbing 3-5 steps with a railing? : None 6 Click Score: 24    End of Session Equipment Utilized During Treatment: Back brace Activity Tolerance: Patient tolerated treatment well Patient left: Other (comment) (pt left in bathroom) Nurse Communication: Mobility status PT Visit Diagnosis: Other abnormalities of gait and mobility (R26.89)    Time: 3474-2595 PT Time Calculation (min) (ACUTE ONLY): 11 min   Charges:   PT Evaluation $PT Eval Low Complexity: 1 Low          Arlyss Gandy, PT, DPT Acute Rehabilitation Pager: [phone]   Arlyss Gandy 06/21/2021, 9:44 AM

## 2021-06-21 NOTE — Progress Notes (Signed)
    Patient doing well  Patient reports resolution of preop right leg pain    Physical Exam: Vitals:   06/20/21 2329 06/21/21 0353  BP: 121/68 (!) 127/57  Pulse: (!) 57 89  Resp: 18 18  Temp: 98.6 F (37 C) 99.5 F (37.5 C)  SpO2: 99% 99%   Patient looks comfortable Dressing in place NVI  POD #1 s/p L5/S1 revision decompression and fusion  - up with PT/OT, encourage ambulation - Percocet for pain, Robaxin for muscle spasms - d/c home today with f/u in 2 weeks

## 2021-06-22 ENCOUNTER — Encounter (HOSPITAL_COMMUNITY): Payer: Self-pay | Admitting: Orthopedic Surgery

## 2021-06-28 NOTE — Discharge Summary (Signed)
Patient ID: Suzanne Santos MRN: [id number] DOB/AGE: Jan 12, 1981 40 y.o.  Admit date: 06/20/2021 Discharge date: 06/21/2021  Admission Diagnoses:  Active Problems:   Lumbar radiculopathy   Discharge Diagnoses:  Same  Past Medical History:  Diagnosis Date   Complication of anesthesia    Depression    PREVENTITIVE   HSV (herpes simplex virus) infection    treated with Valtrex   Hypertension    Infertility, female    conceived on [facility]   PONV (postoperative nausea and vomiting)     Surgeries: Procedure(s): RIGHT-SIDED LUMBAR five - SACRUM one TRANSFORAMINAL LUMBAR INTERBODY FUSION WITH INSTRUMENTATION AND ALLOGRAFT on 06/20/2021   Consultants: None  Discharged Condition: Improved  Hospital Course: Suzanne Santos is an 40 y.o. female who was admitted 06/20/2021 for operative treatment of Radiculopathy. Patient has severe unremitting pain that affects sleep, daily activities, and work/hobbies. After pre-op clearance the patient was taken to the operating room on 06/20/2021 and underwent  Procedure(s): RIGHT-SIDED LUMBAR five - SACRUM one TRANSFORAMINAL LUMBAR INTERBODY FUSION WITH INSTRUMENTATION AND ALLOGRAFT.    Patient was given perioperative antibiotics:  Anti-infectives (From admission, onward)    Start     Dose/Rate Route Frequency Ordered Stop   06/20/21 2030  ceFAZolin (ANCEF) IVPB 2g/100 mL premix        2 g 200 mL/hr over 30 Minutes Intravenous Every 8 hours 06/20/21 1741 06/21/21 0325   06/20/21 1000  ceFAZolin (ANCEF) IVPB 2g/100 mL premix        2 g 200 mL/hr over 30 Minutes Intravenous On call to O.R. 06/20/21 0956 06/20/21 1305        Patient was given sequential compression devices, early ambulation to prevent DVT.  Patient benefited maximally from hospital stay and there were no complications.    Recent vital signs: BP (!) 118/56 (BP Location: Right Arm)   Pulse 63   Temp 99 F (37.2 C) (Oral)   Resp 18   Ht 5\' 7"  (1.702 m)   Wt 73.9  kg   LMP 06/11/2021 (Exact Date)   SpO2 100%   BMI 25.53 kg/m    Discharge Medications:   Allergies as of 06/21/2021       Reactions   Cranberry Hives   Pt says she takes the tablets   Phenazopyridine Rash   OTC Azo        Medication List     STOP taking these medications    acetaminophen 500 MG tablet Commonly known as: TYLENOL       TAKE these medications    buPROPion 150 MG 24 hr tablet Commonly known as: WELLBUTRIN XL Take 450 mg by mouth daily.   Cartia XT 240 MG 24 hr capsule Generic drug: diltiazem Take 240 mg by mouth daily.   HYDROcodone-acetaminophen 5-325 MG tablet Commonly known as: NORCO/VICODIN Take 1 tablet by mouth every 6 (six) hours as needed for pain.   methocarbamol 500 MG tablet Commonly known as: ROBAXIN Take 1 tablet (500 mg total) by mouth every 6 (six) hours as needed for muscle spasms.   oxyCODONE-acetaminophen 5-325 MG tablet Commonly known as: PERCOCET/ROXICET Take 1-2 tablets by mouth every 4 (four) hours as needed for moderate pain.        Diagnostic Studies: DG Lumbar Spine 2-3 Views  Result Date: 06/20/2021 CLINICAL DATA:  Lumbar fusion L5-S1 EXAM: LUMBAR SPINE - 2-3 VIEW COMPARISON:  06/20/2021 FINDINGS: PA and lateral C-arm images of the lumbar spine were obtained. Pedicle screw and interbody fusion  L5-S1 in satisfactory position and alignment. IMPRESSION: PLIF L5-S1. Electronically Signed   By: Marlan Palau M.D.   On: 06/20/2021 18:42   DG Lumbar Spine 1 View  Result Date: 06/20/2021 CLINICAL DATA:  Lumbar localization for fusion L5-S1 EXAM: LUMBAR SPINE - 1 VIEW COMPARISON:  12/21/2012.  Lumbar MRI 06/20/2021 FINDINGS: Single lateral view lumbar spine is obtained in the operating room. Needle in the soft tissues posterior to L4 spinous process. Second needle posterior to the L5 spinous process. IMPRESSION: Localization of L4 and L5 spinous processes. Electronically Signed   By: Marlan Palau M.D.   On: 06/20/2021  18:43   DG C-Arm 1-60 Min-No Report  Result Date: 06/20/2021 Fluoroscopy was utilized by the requesting physician.  No radiographic interpretation.   DG C-Arm 1-60 Min-No Report  Result Date: 06/20/2021 Fluoroscopy was utilized by the requesting physician.  No radiographic interpretation.   DG C-Arm 1-60 Min-No Report  Result Date: 06/20/2021 Fluoroscopy was utilized by the requesting physician.  No radiographic interpretation.    Disposition: Discharge disposition: 01-Home or Self Care        POD #1 s/p L5/S1 revision decompression and fusion   - up with PT/OT, encourage ambulation - Percocet for pain, Robaxin for muscle spasms -Scripts for pain sent to pharmacy electronically  -D/C instructions sheet printed and in chart -D/C today  -F/U in office 2 weeks   Signed: Eilene Ghazi [name] 06/28/2021, 12:05 PM

## 2023-12-08 DIAGNOSIS — I10 Essential (primary) hypertension: Secondary | ICD-10-CM | POA: Diagnosis not present

## 2023-12-08 DIAGNOSIS — M5459 Other low back pain: Secondary | ICD-10-CM | POA: Diagnosis not present

## 2023-12-08 DIAGNOSIS — G43909 Migraine, unspecified, not intractable, without status migrainosus: Secondary | ICD-10-CM | POA: Diagnosis not present

## 2023-12-08 DIAGNOSIS — F330 Major depressive disorder, recurrent, mild: Secondary | ICD-10-CM | POA: Diagnosis not present

## 2024-03-07 DIAGNOSIS — R300 Dysuria: Secondary | ICD-10-CM | POA: Diagnosis not present

## 2024-04-01 DIAGNOSIS — R232 Flushing: Secondary | ICD-10-CM | POA: Diagnosis not present

## 2024-04-01 DIAGNOSIS — F330 Major depressive disorder, recurrent, mild: Secondary | ICD-10-CM | POA: Diagnosis not present

## 2024-04-01 DIAGNOSIS — I10 Essential (primary) hypertension: Secondary | ICD-10-CM | POA: Diagnosis not present

## 2024-04-01 DIAGNOSIS — N182 Chronic kidney disease, stage 2 (mild): Secondary | ICD-10-CM | POA: Diagnosis not present

## 2024-07-29 DIAGNOSIS — I10 Essential (primary) hypertension: Secondary | ICD-10-CM | POA: Diagnosis not present

## 2024-07-29 DIAGNOSIS — M5459 Other low back pain: Secondary | ICD-10-CM | POA: Diagnosis not present

## 2024-07-29 DIAGNOSIS — R232 Flushing: Secondary | ICD-10-CM | POA: Diagnosis not present

## 2024-07-29 DIAGNOSIS — L309 Dermatitis, unspecified: Secondary | ICD-10-CM | POA: Diagnosis not present

## 2024-07-29 DIAGNOSIS — G43909 Migraine, unspecified, not intractable, without status migrainosus: Secondary | ICD-10-CM | POA: Diagnosis not present
